# Patient Record
Sex: Female | Born: 1965
Health system: Southern US, Community
[De-identification: ages and names within clinical notes are randomized; demographics above are authoritative.]

---

## 2000-05-29 HISTORY — PX: BREAST BIOPSY: SHX20

## 2000-05-29 HISTORY — PX: BREAST EXCISIONAL BIOPSY: SUR124

## 2004-05-13 ENCOUNTER — Ambulatory Visit: Payer: Self-pay | Admitting: Obstetrics and Gynecology

## 2005-06-27 ENCOUNTER — Ambulatory Visit: Payer: Self-pay | Admitting: Obstetrics and Gynecology

## 2005-07-06 ENCOUNTER — Ambulatory Visit: Payer: Self-pay | Admitting: Obstetrics and Gynecology

## 2006-06-29 ENCOUNTER — Ambulatory Visit: Payer: Self-pay | Admitting: Obstetrics and Gynecology

## 2007-07-12 ENCOUNTER — Ambulatory Visit: Payer: Self-pay | Admitting: Obstetrics and Gynecology

## 2007-07-17 ENCOUNTER — Ambulatory Visit: Payer: Self-pay | Admitting: Obstetrics and Gynecology

## 2008-04-06 ENCOUNTER — Ambulatory Visit: Payer: Self-pay | Admitting: General Practice

## 2008-07-17 ENCOUNTER — Ambulatory Visit: Payer: Self-pay | Admitting: Obstetrics and Gynecology

## 2009-07-19 ENCOUNTER — Ambulatory Visit: Payer: Self-pay | Admitting: Obstetrics and Gynecology

## 2010-07-20 ENCOUNTER — Ambulatory Visit: Payer: Self-pay | Admitting: Obstetrics and Gynecology

## 2011-08-23 ENCOUNTER — Ambulatory Visit: Payer: Self-pay | Admitting: Obstetrics and Gynecology

## 2012-08-23 ENCOUNTER — Ambulatory Visit: Payer: Self-pay | Admitting: Obstetrics and Gynecology

## 2013-08-29 ENCOUNTER — Ambulatory Visit: Payer: Self-pay | Admitting: Obstetrics and Gynecology

## 2014-03-02 ENCOUNTER — Encounter: Payer: Self-pay | Admitting: Podiatry

## 2014-03-02 ENCOUNTER — Ambulatory Visit (INDEPENDENT_AMBULATORY_CARE_PROVIDER_SITE_OTHER): Payer: BC Managed Care – PPO

## 2014-03-02 ENCOUNTER — Ambulatory Visit (INDEPENDENT_AMBULATORY_CARE_PROVIDER_SITE_OTHER): Payer: BC Managed Care – PPO | Admitting: Podiatry

## 2014-03-02 VITALS — BP 105/61 | HR 67 | Resp 16 | Ht 66.0 in | Wt 130.0 lb

## 2014-03-02 DIAGNOSIS — M21611 Bunion of right foot: Secondary | ICD-10-CM

## 2014-03-02 DIAGNOSIS — M2011 Hallux valgus (acquired), right foot: Secondary | ICD-10-CM

## 2014-03-02 NOTE — Progress Notes (Signed)
She presents today with light to discuss surgical intervention regarding her right foot. She states that her bunion deformity on her right foot has become more troublesome over the past few months it is becoming increasingly more painful and is limiting her daily activities. She can barely wear shoes with this. She like to have this surgically corrected. She denies any changes in her past medical history medications allergies are social history.  Objective: Pulses are stable she is alert and oriented x3 I have reviewed her past medical history medications allergies surgeries social history. Pulses are strongly palpable bilateral feet. She has pain on palpation and range of motion of the first metatarsophalangeal joint of the right foot over the left foot. She has pain on end range of motion of the first metatarsophalangeal joint right. Radiographic continuation demonstrates an increase in the first intermetatarsal angle with lateral deviation of the hallux. Both of these are greater than normal values.  Assessment hallux abductovalgus deformity right greater than left. Painful daily activities.  Plan: We went over consent form today line bylined number by number giving her ample time to ask questions she saw fit regarding an [place] bunion repair with screw fixation right first metatarsophalangeal joint. Answered all the questions regarding his procedures to the best of my ability in layman's terms. We did discuss a possible postop complications which may include but are not limited to postop pain bleeding swelling infection recurrence need for further surgery. She understands this and is amenable to it and signed all 3 pages of the consent form. She was dispensed a Cam Walker today for postop care and I will followup with her next month for surgical intervention and [facility] specialty surgical center

## 2014-03-04 ENCOUNTER — Ambulatory Visit: Payer: Self-pay | Admitting: General Practice

## 2014-04-08 ENCOUNTER — Other Ambulatory Visit: Payer: Self-pay | Admitting: Podiatry

## 2014-04-08 MED ORDER — OXYCODONE-ACETAMINOPHEN 10-325 MG PO TABS: 1.0000 | ORAL_TABLET | ORAL | Status: DC | PRN

## 2014-04-08 MED ORDER — PROMETHAZINE HCL 25 MG PO TABS: 25.0000 mg | ORAL_TABLET | Freq: Three times a day (TID) | ORAL | Status: DC | PRN

## 2014-04-08 MED ORDER — CEPHALEXIN 500 MG PO CAPS: 500.0000 mg | ORAL_CAPSULE | Freq: Three times a day (TID) | ORAL | Status: DC

## 2014-04-10 ENCOUNTER — Encounter: Payer: Self-pay | Admitting: Podiatry

## 2014-04-10 DIAGNOSIS — M2011 Hallux valgus (acquired), right foot: Secondary | ICD-10-CM

## 2014-04-12 ENCOUNTER — Telehealth: Payer: Self-pay

## 2014-04-12 NOTE — Telephone Encounter (Signed)
  Left message for patient to call with questions or concerns regarding post operative status 

## 2014-04-12 NOTE — Telephone Encounter (Signed)
Left message for patient to call with questions or concerns. 

## 2014-04-12 NOTE — Progress Notes (Signed)
Dr Milinda Pointer performed a right [place] bunion repair on 04/10/14 with screw placement

## 2014-04-15 ENCOUNTER — Ambulatory Visit (INDEPENDENT_AMBULATORY_CARE_PROVIDER_SITE_OTHER): Payer: BC Managed Care – PPO | Admitting: Podiatry

## 2014-04-15 ENCOUNTER — Encounter: Payer: Self-pay | Admitting: Podiatry

## 2014-04-15 ENCOUNTER — Ambulatory Visit (INDEPENDENT_AMBULATORY_CARE_PROVIDER_SITE_OTHER): Payer: BC Managed Care – PPO

## 2014-04-15 VITALS — BP 105/65 | HR 70 | Temp 98.7°F | Resp 16

## 2014-04-15 DIAGNOSIS — M2011 Hallux valgus (acquired), right foot: Secondary | ICD-10-CM

## 2014-04-15 DIAGNOSIS — M21611 Bunion of right foot: Secondary | ICD-10-CM

## 2014-04-15 DIAGNOSIS — Z9889 Other specified postprocedural states: Secondary | ICD-10-CM

## 2014-04-15 NOTE — Progress Notes (Signed)
She presents today status post one week [place] bunion repair with screw right foot. She states that she's had absolutely no pain.  Objective: Vital signs are stable she is alert and oriented 3. She presents with her Cam Walker intact. Dry sterile dressing was removed demonstrates no erythema is mild edema no cellulitis drainage or odor. Sutures are in place margins appear to be well coapted. She has great range of motion of the toe without pain. Radiographs confirm the placement of the capital fragment and screw.  Assessment well-healing surgical toe status post one week [place] bunion repair right.  Plan: [place] today drastic compressive dressing follow-up with me after Thanksgiving for anklet and a Darco shoe.

## 2014-04-16 ENCOUNTER — Telehealth: Payer: Self-pay | Admitting: *Deleted

## 2014-04-16 NOTE — Telephone Encounter (Signed)
PT CALLED WANTING TO KNOW IF SHE NEEDED TO STILL SLEEP IN THE BOOT AT NIGHT. TOLD PT TO SLEEP IN BOOT EVERY NIGHT TILL SHE COMES BACK TO HER NEXT APPT. PT UNDERSTOOD.

## 2014-04-27 ENCOUNTER — Ambulatory Visit (INDEPENDENT_AMBULATORY_CARE_PROVIDER_SITE_OTHER): Payer: BC Managed Care – PPO | Admitting: Podiatry

## 2014-04-27 VITALS — BP 101/60 | HR 81 | Resp 16

## 2014-04-27 DIAGNOSIS — Z9889 Other specified postprocedural states: Secondary | ICD-10-CM

## 2014-04-27 NOTE — Progress Notes (Signed)
She presents a days to weeks status post [facility] bunion repair right foot. States it really hasn't bothered me much at all.  Objective: Vital signs are stable she is alert and oriented 3. No erythema edema cellulitis drainage or odor margins remain well coapted she has a great range of motion of the first metatarsophalangeal joint right foot.  Assessment: Well-healing surgical foot right.  Plan: Continue range of motion exercises placed her in a compression anklet and a Darco shoe today I will follow up with her in 2 weeks for another set of x-rays.

## 2014-05-11 ENCOUNTER — Ambulatory Visit (INDEPENDENT_AMBULATORY_CARE_PROVIDER_SITE_OTHER): Payer: BC Managed Care – PPO

## 2014-05-11 ENCOUNTER — Ambulatory Visit (INDEPENDENT_AMBULATORY_CARE_PROVIDER_SITE_OTHER): Payer: BC Managed Care – PPO | Admitting: Podiatry

## 2014-05-11 ENCOUNTER — Encounter: Payer: Self-pay | Admitting: Podiatry

## 2014-05-11 DIAGNOSIS — M21611 Bunion of right foot: Secondary | ICD-10-CM

## 2014-05-11 DIAGNOSIS — Z9889 Other specified postprocedural states: Secondary | ICD-10-CM

## 2014-05-11 DIAGNOSIS — M2011 Hallux valgus (acquired), right foot: Secondary | ICD-10-CM

## 2014-05-11 NOTE — Progress Notes (Signed)
She presents today for follow-up of her [facility] bunion repair right foot. She states that it feels a little tight.  Objective: Vital signs are stable she is alert and oriented 3. R osteotomy with screw fixation appears to be healing very nicely on x-ray. He has yet to be 1 month since her surgery. She has good range of motion of the first metatarsophalangeal joint of the right foot and minimal edema.  Assessment: Sonya Burke surgical foot right.  Plan: I would allow her to get back into a pair of tennis shoes over the next couple of weeks and I'll follow-up with her in 1 month for another set of x-rays.

## 2014-06-08 ENCOUNTER — Ambulatory Visit (INDEPENDENT_AMBULATORY_CARE_PROVIDER_SITE_OTHER): Payer: BLUE CROSS/BLUE SHIELD

## 2014-06-08 ENCOUNTER — Ambulatory Visit (INDEPENDENT_AMBULATORY_CARE_PROVIDER_SITE_OTHER): Payer: BLUE CROSS/BLUE SHIELD | Admitting: Podiatry

## 2014-06-08 VITALS — BP 108/59 | HR 81 | Resp 16

## 2014-06-08 DIAGNOSIS — M2011 Hallux valgus (acquired), right foot: Secondary | ICD-10-CM

## 2014-06-08 DIAGNOSIS — M21611 Bunion of right foot: Secondary | ICD-10-CM

## 2014-06-08 DIAGNOSIS — Z9889 Other specified postprocedural states: Secondary | ICD-10-CM

## 2014-06-08 NOTE — Progress Notes (Signed)
She presents today 2 months status post [facility] bunion repair left foot. She states it seems to be doing well.  Objective: Vital signs are stable she is alert and oriented 3 she has great range of motion first metatarsophalangeal joint. Radiographs confirm well-healed osteotomy.  Assessment: Well-healing surgical foot status post [place] bunion repair with screw fixation.  Plan: Release her and I will follow-up with her as needed.

## 2014-09-04 ENCOUNTER — Ambulatory Visit
Admit: 2014-09-04 | Disposition: A | Discharge status: Home / Self Care | Payer: Self-pay | Attending: Obstetrics and Gynecology | Admitting: Obstetrics and Gynecology

## 2015-08-31 ENCOUNTER — Other Ambulatory Visit: Payer: Self-pay | Admitting: Obstetrics and Gynecology

## 2015-08-31 DIAGNOSIS — Z1231 Encounter for screening mammogram for malignant neoplasm of breast: Secondary | ICD-10-CM

## 2015-09-06 ENCOUNTER — Ambulatory Visit
Admission: RE | Admit: 2015-09-06 | Discharge: 2015-09-06 | Disposition: A | Discharge status: Home / Self Care | Payer: BLUE CROSS/BLUE SHIELD | Source: Ambulatory Visit | Attending: Obstetrics and Gynecology | Admitting: Obstetrics and Gynecology

## 2015-09-06 DIAGNOSIS — Z1231 Encounter for screening mammogram for malignant neoplasm of breast: Secondary | ICD-10-CM | POA: Insufficient documentation

## 2015-09-08 ENCOUNTER — Other Ambulatory Visit: Payer: Self-pay | Admitting: Obstetrics and Gynecology

## 2015-09-08 DIAGNOSIS — R928 Other abnormal and inconclusive findings on diagnostic imaging of breast: Secondary | ICD-10-CM

## 2015-09-14 ENCOUNTER — Ambulatory Visit
Admission: RE | Admit: 2015-09-14 | Discharge: 2015-09-14 | Disposition: A | Discharge status: Home / Self Care | Payer: BLUE CROSS/BLUE SHIELD | Source: Ambulatory Visit | Attending: Obstetrics and Gynecology | Admitting: Obstetrics and Gynecology

## 2015-09-14 DIAGNOSIS — N63 Unspecified lump in breast: Secondary | ICD-10-CM | POA: Diagnosis not present

## 2015-09-14 DIAGNOSIS — N6041 Mammary duct ectasia of right breast: Secondary | ICD-10-CM | POA: Insufficient documentation

## 2015-09-14 DIAGNOSIS — R928 Other abnormal and inconclusive findings on diagnostic imaging of breast: Secondary | ICD-10-CM

## 2016-09-05 ENCOUNTER — Other Ambulatory Visit: Payer: Self-pay | Admitting: Obstetrics and Gynecology

## 2016-09-05 DIAGNOSIS — Z1231 Encounter for screening mammogram for malignant neoplasm of breast: Secondary | ICD-10-CM

## 2016-09-26 ENCOUNTER — Ambulatory Visit
Admission: RE | Admit: 2016-09-26 | Discharge: 2016-09-26 | Disposition: A | Discharge status: Home / Self Care | Payer: BLUE CROSS/BLUE SHIELD | Source: Ambulatory Visit | Attending: Obstetrics and Gynecology | Admitting: Obstetrics and Gynecology

## 2016-09-26 DIAGNOSIS — Z1231 Encounter for screening mammogram for malignant neoplasm of breast: Secondary | ICD-10-CM | POA: Insufficient documentation

## 2017-07-12 IMAGING — MG MM DIGITAL SCREENING BILAT W/ CAD
4 series · 4 of 4 positions shown · non-contrast
Comparison: Previous exam(s).

CLINICAL DATA: Screening.

EXAM:
DIGITAL SCREENING BILATERAL MAMMOGRAM WITH CAD

[L MLO]
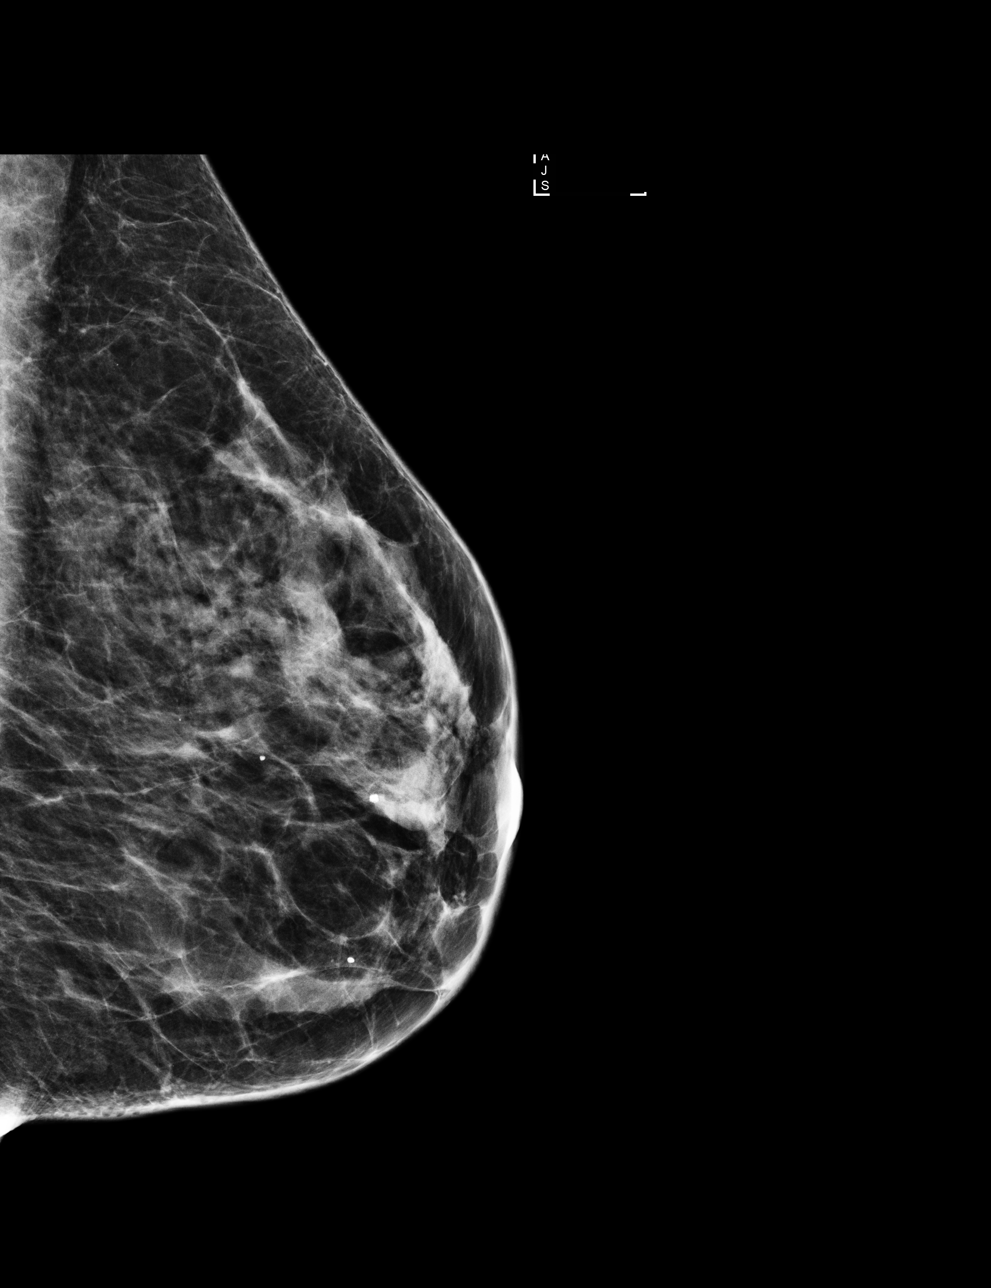

[R CC]
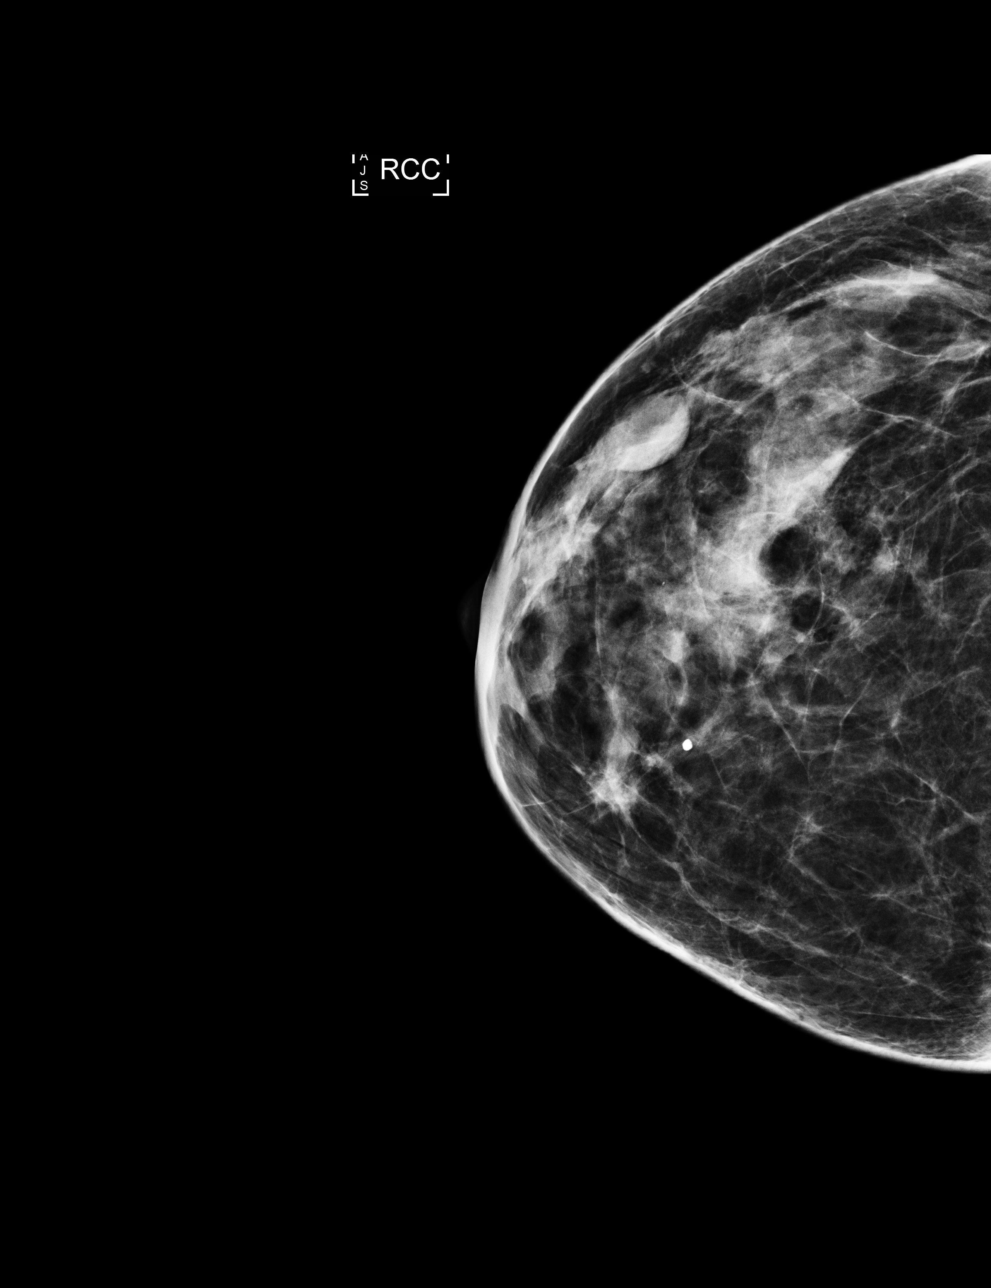

[R MLO]
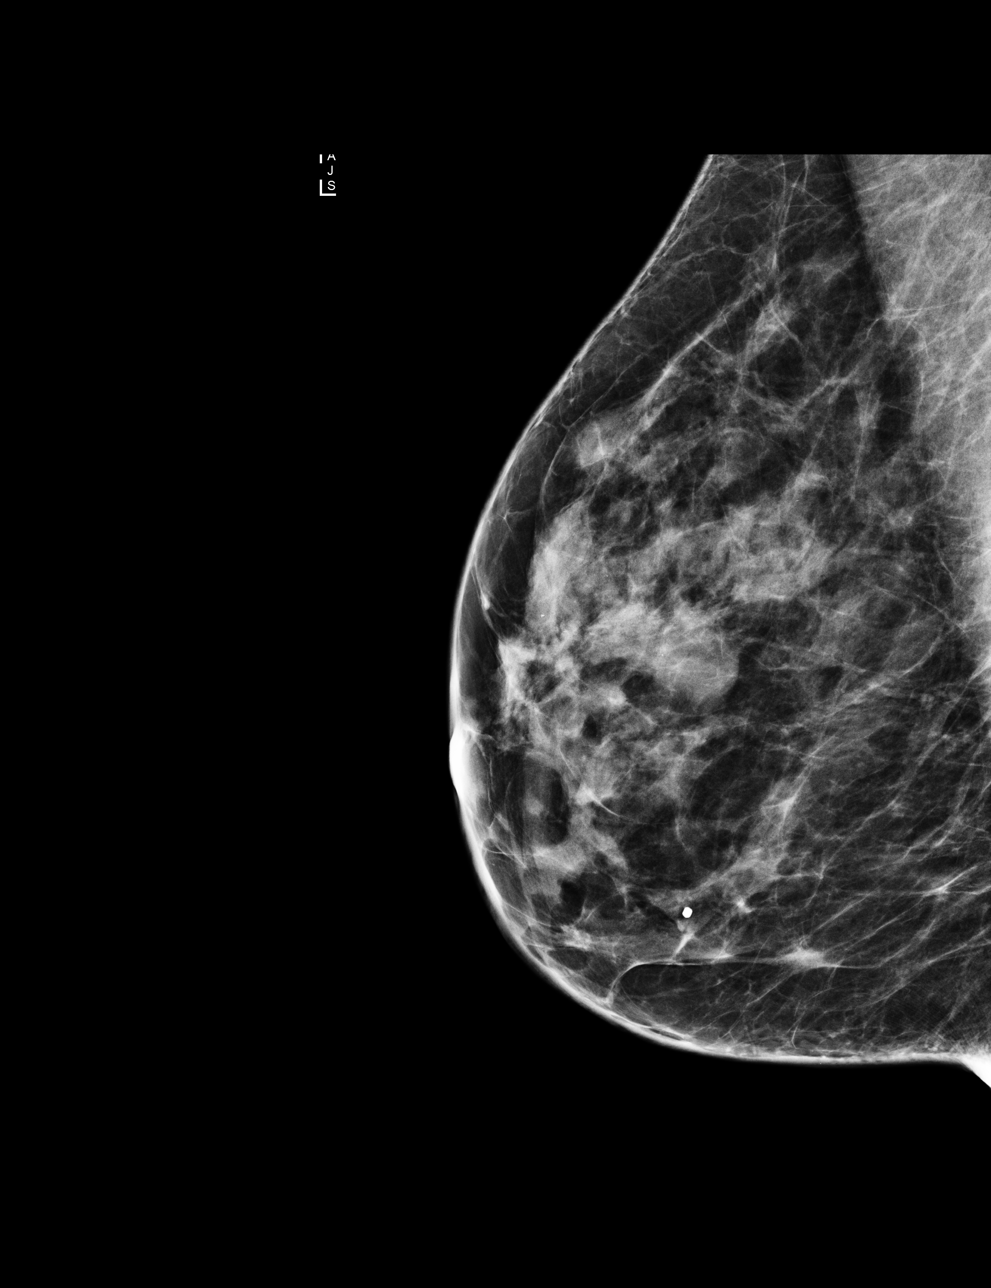

[L CC]
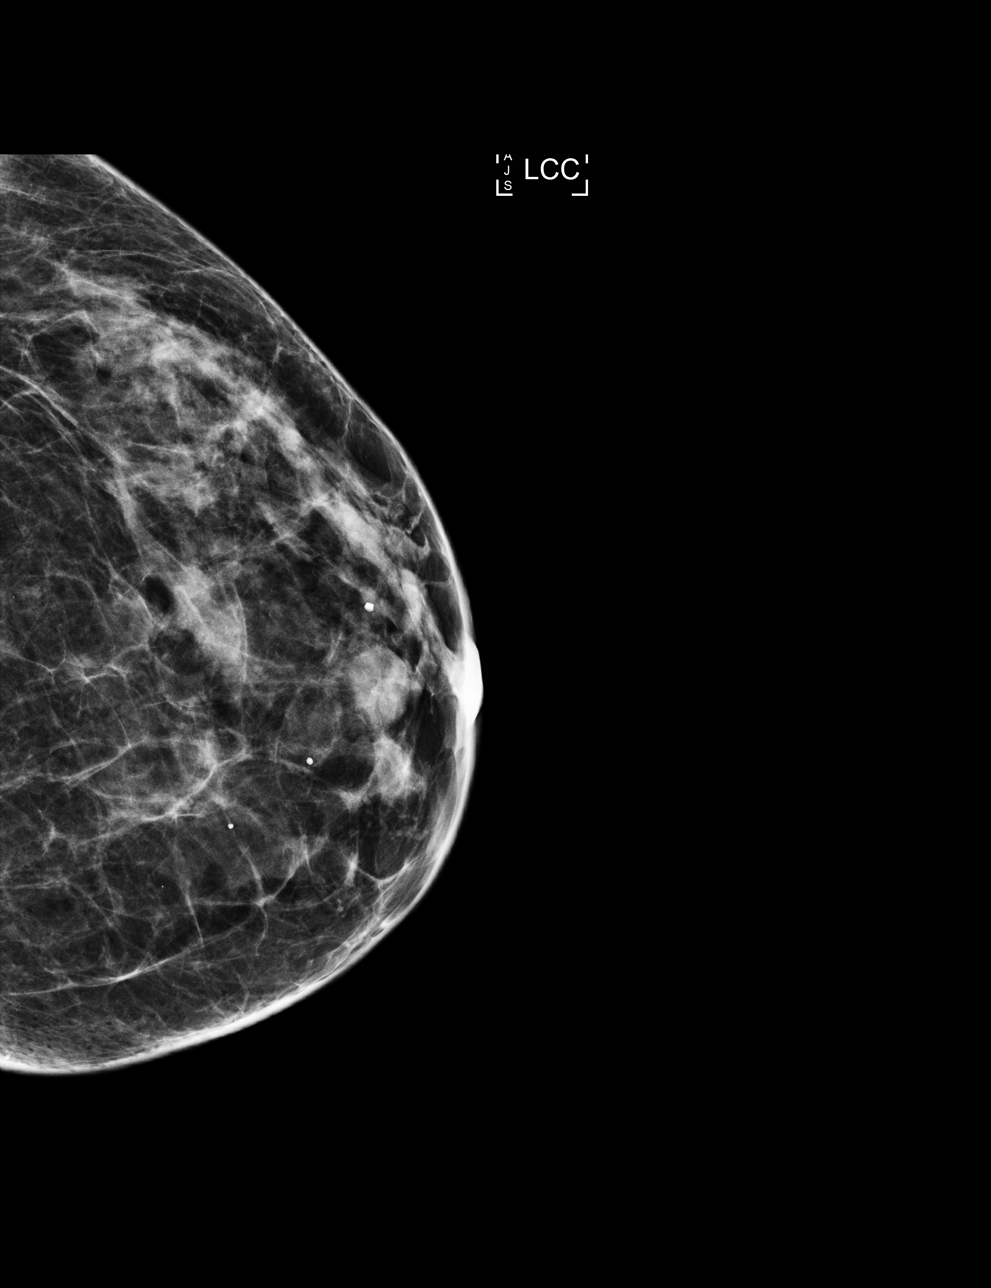

[4 of 4 positions shown; findings below may reference images not displayed]

ACR Breast Density Category c: The breast tissue is heterogeneously
dense, which may obscure small masses.
FINDINGS: In the right breast, a possible mass warrants further evaluation. In
the left breast, no findings suspicious for malignancy. Images were
processed with CAD.
IMPRESSION: Further evaluation is suggested for possible mass in the right
breast.

RECOMMENDATION:
Diagnostic mammogram and possibly ultrasound of the right breast.
(Code:[id number])

The patient will be contacted regarding the findings, and additional
imaging will be scheduled.

BI-RADS CATEGORY  0: Incomplete. Need additional imaging evaluation
and/or prior mammograms for comparison.

## 2017-09-06 ENCOUNTER — Other Ambulatory Visit: Payer: Self-pay | Admitting: Obstetrics and Gynecology

## 2017-09-06 DIAGNOSIS — Z1231 Encounter for screening mammogram for malignant neoplasm of breast: Secondary | ICD-10-CM

## 2017-09-27 ENCOUNTER — Ambulatory Visit
Admission: RE | Admit: 2017-09-27 | Discharge: 2017-09-27 | Disposition: A | Discharge status: Home / Self Care | Payer: BLUE CROSS/BLUE SHIELD | Source: Ambulatory Visit | Attending: Obstetrics and Gynecology | Admitting: Obstetrics and Gynecology

## 2017-09-27 DIAGNOSIS — Z1231 Encounter for screening mammogram for malignant neoplasm of breast: Secondary | ICD-10-CM | POA: Diagnosis present

## 2018-10-14 ENCOUNTER — Other Ambulatory Visit: Payer: Self-pay | Admitting: Obstetrics and Gynecology

## 2018-10-14 DIAGNOSIS — Z1231 Encounter for screening mammogram for malignant neoplasm of breast: Secondary | ICD-10-CM

## 2018-11-13 ENCOUNTER — Ambulatory Visit
Admission: RE | Admit: 2018-11-13 | Discharge: 2018-11-13 | Disposition: A | Discharge status: Home / Self Care | Payer: BC Managed Care – PPO | Source: Ambulatory Visit | Attending: Obstetrics and Gynecology | Admitting: Obstetrics and Gynecology

## 2018-11-13 ENCOUNTER — Other Ambulatory Visit: Payer: Self-pay

## 2018-11-13 DIAGNOSIS — Z1231 Encounter for screening mammogram for malignant neoplasm of breast: Secondary | ICD-10-CM | POA: Insufficient documentation

## 2019-02-14 DIAGNOSIS — M531 Cervicobrachial syndrome: Secondary | ICD-10-CM | POA: Diagnosis not present

## 2019-02-14 DIAGNOSIS — M9902 Segmental and somatic dysfunction of thoracic region: Secondary | ICD-10-CM | POA: Diagnosis not present

## 2019-02-14 DIAGNOSIS — M9901 Segmental and somatic dysfunction of cervical region: Secondary | ICD-10-CM | POA: Diagnosis not present

## 2019-02-27 ENCOUNTER — Ambulatory Visit: Payer: Self-pay

## 2019-02-27 DIAGNOSIS — Z23 Encounter for immunization: Secondary | ICD-10-CM

## 2019-09-16 ENCOUNTER — Other Ambulatory Visit: Payer: Self-pay

## 2019-09-19 ENCOUNTER — Other Ambulatory Visit: Payer: Self-pay

## 2019-09-19 DIAGNOSIS — Z0000 Encounter for general adult medical examination without abnormal findings: Secondary | ICD-10-CM

## 2019-09-19 NOTE — Progress Notes (Signed)
Requesting lab panel for upcoming physical with outside provider, Dr. Benjaman Kindler.  AMD

## 2019-09-20 LAB — CMP12+LP+TP+TSH+6AC+CBC/D/PLT
ALT: 10 [IU]/L (ref 0–32)
AST: 13 [IU]/L (ref 0–40)
Albumin/Globulin Ratio: 1.8 (ref 1.2–2.2)
Albumin: 4.4 g/dL (ref 3.8–4.9)
Alkaline Phosphatase: 42 [IU]/L (ref 39–117)
BUN/Creatinine Ratio: 15 (ref 9–23)
BUN: 11 mg/dL (ref 6–24)
Basophils Absolute: 0.1 10*3/uL (ref 0.0–0.2)
Basos: 2 %
Bilirubin Total: 0.4 mg/dL (ref 0.0–1.2)
Calcium: 8.7 mg/dL (ref 8.7–10.2)
Chloride: 101 mmol/L (ref 96–106)
Chol/HDL Ratio: 2.3 ratio (ref 0.0–4.4)
Cholesterol, Total: 164 mg/dL (ref 100–199)
Creatinine, Ser: 0.74 mg/dL (ref 0.57–1.00)
EOS (ABSOLUTE): 0.1 10*3/uL (ref 0.0–0.4)
Eos: 4 %
Free Thyroxine Index: 1.9 (ref 1.2–4.9)
GFR calc Af Amer: 106 mL/min/{1.73_m2}
GFR calc non Af Amer: 92 mL/min/{1.73_m2}
GGT: 9 [IU]/L (ref 0–60)
Globulin, Total: 2.4 g/dL (ref 1.5–4.5)
Glucose: 96 mg/dL (ref 65–99)
HDL: 70 mg/dL
Hematocrit: 39.2 % (ref 34.0–46.6)
Hemoglobin: 12.9 g/dL (ref 11.1–15.9)
Immature Grans (Abs): 0 10*3/uL (ref 0.0–0.1)
Immature Granulocytes: 0 %
Iron: 63 ug/dL (ref 27–159)
LDH: 120 [IU]/L (ref 119–226)
LDL Chol Calc (NIH): 85 mg/dL (ref 0–99)
Lymphocytes Absolute: 1.2 10*3/uL (ref 0.7–3.1)
Lymphs: 41 %
MCH: 28.7 pg (ref 26.6–33.0)
MCHC: 32.9 g/dL (ref 31.5–35.7)
MCV: 87 fL (ref 79–97)
Monocytes Absolute: 0.2 10*3/uL (ref 0.1–0.9)
Monocytes: 7 %
Neutrophils Absolute: 1.4 10*3/uL (ref 1.4–7.0)
Neutrophils: 46 %
Phosphorus: 3.5 mg/dL (ref 3.0–4.3)
Platelets: 246 10*3/uL (ref 150–450)
Potassium: 4.1 mmol/L (ref 3.5–5.2)
RBC: 4.49 x10E6/uL (ref 3.77–5.28)
RDW: 12.8 % (ref 11.7–15.4)
Sodium: 135 mmol/L (ref 134–144)
T3 Uptake Ratio: 27 % (ref 24–39)
T4, Total: 7.2 ug/dL (ref 4.5–12.0)
TSH: 1.89 u[IU]/mL (ref 0.450–4.500)
Total Protein: 6.8 g/dL (ref 6.0–8.5)
Triglycerides: 40 mg/dL (ref 0–149)
Uric Acid: 3.5 mg/dL (ref 3.0–7.2)
VLDL Cholesterol Cal: 9 mg/dL (ref 5–40)
WBC: 3.1 10*3/uL — ABNORMAL LOW (ref 3.4–10.8)

## 2019-09-30 ENCOUNTER — Other Ambulatory Visit: Payer: Self-pay | Admitting: Obstetrics and Gynecology

## 2019-09-30 DIAGNOSIS — Z1231 Encounter for screening mammogram for malignant neoplasm of breast: Secondary | ICD-10-CM | POA: Diagnosis not present

## 2019-09-30 DIAGNOSIS — Z01419 Encounter for gynecological examination (general) (routine) without abnormal findings: Secondary | ICD-10-CM | POA: Diagnosis not present

## 2019-09-30 DIAGNOSIS — Z124 Encounter for screening for malignant neoplasm of cervix: Secondary | ICD-10-CM | POA: Diagnosis not present

## 2019-10-01 ENCOUNTER — Ambulatory Visit (INDEPENDENT_AMBULATORY_CARE_PROVIDER_SITE_OTHER): Payer: 59 | Admitting: Dermatology

## 2019-10-01 ENCOUNTER — Other Ambulatory Visit: Payer: Self-pay

## 2019-10-01 DIAGNOSIS — D229 Melanocytic nevi, unspecified: Secondary | ICD-10-CM | POA: Diagnosis not present

## 2019-10-01 DIAGNOSIS — D2272 Melanocytic nevi of left lower limb, including hip: Secondary | ICD-10-CM | POA: Diagnosis not present

## 2019-10-01 DIAGNOSIS — L300 Nummular dermatitis: Secondary | ICD-10-CM

## 2019-10-01 DIAGNOSIS — L578 Other skin changes due to chronic exposure to nonionizing radiation: Secondary | ICD-10-CM

## 2019-10-01 DIAGNOSIS — D2262 Melanocytic nevi of left upper limb, including shoulder: Secondary | ICD-10-CM | POA: Diagnosis not present

## 2019-10-01 DIAGNOSIS — L814 Other melanin hyperpigmentation: Secondary | ICD-10-CM

## 2019-10-01 DIAGNOSIS — L719 Rosacea, unspecified: Secondary | ICD-10-CM

## 2019-10-01 DIAGNOSIS — D1801 Hemangioma of skin and subcutaneous tissue: Secondary | ICD-10-CM | POA: Diagnosis not present

## 2019-10-01 DIAGNOSIS — D492 Neoplasm of unspecified behavior of bone, soft tissue, and skin: Secondary | ICD-10-CM

## 2019-10-01 DIAGNOSIS — L820 Inflamed seborrheic keratosis: Secondary | ICD-10-CM

## 2019-10-01 DIAGNOSIS — D485 Neoplasm of uncertain behavior of skin: Secondary | ICD-10-CM | POA: Diagnosis not present

## 2019-10-01 DIAGNOSIS — Z1283 Encounter for screening for malignant neoplasm of skin: Secondary | ICD-10-CM

## 2019-10-01 DIAGNOSIS — D225 Melanocytic nevi of trunk: Secondary | ICD-10-CM | POA: Diagnosis not present

## 2019-10-01 DIAGNOSIS — L081 Erythrasma: Secondary | ICD-10-CM | POA: Diagnosis not present

## 2019-10-01 DIAGNOSIS — L821 Other seborrheic keratosis: Secondary | ICD-10-CM

## 2019-10-01 MED ORDER — CLINDAMYCIN PHOSPHATE 1 % EX LOTN: TOPICAL_LOTION | Freq: Two times a day (BID) | CUTANEOUS | 0 refills | Status: DC

## 2019-10-01 NOTE — Patient Instructions (Signed)

## 2019-10-01 NOTE — Progress Notes (Signed)
New Patient Visit  Subjective  Sonya Burke is a 54 y.o. female who presents for the following: Annual Exam (TBSE, no hx of skin ca, no fhx skin ca) and growths (inframammary, irritated by bra). Patient presents for total body skin examination for skin cancer screening and mole check.  The following portions of the chart were reviewed this encounter and updated as appropriate:  Allergies  Meds  Problems  Med Hx  Surg Hx  Fam Hx      Review of Systems:  No other skin or systemic complaints except as noted in HPI or Assessment and Plan.  Objective  Well appearing patient in no apparent distress; mood and affect are within normal limits.  A full examination was performed including scalp, head, eyes, ears, nose, lips, neck, chest, axillae, abdomen, back, buttocks, bilateral upper extremities, bilateral lower extremities, hands, feet, fingers, toes, fingernails, and toenails. All findings within normal limits unless otherwise noted below.  Objective  Pubic: Brown discoloration  Objective  Left Inframammary x 4 (4): Erythematous keratotic or waxy stuck-on papule or plaque.   Objective  LUQA lat: 0.6cm dark brown pap    Objective  Left Antecubital: 3.88mm black macule  Objective    Right Thigh - Anterior: 0.5cm brown macule  Left Upper Back paraspinal: 0.5 x 0.4cm brown macule  L pubic area: 1.0cm brown macule  Images        Objective  Bil hips: Scaly erythematous papules and plaques   Objective  face: Erythema and telangiectasias mid face   Assessment & Plan    Melanocytic Nevi - Tan-brown and/or pink-flesh-colored symmetric macules and papules - Benign appearing on exam today - Observation - Call clinic for new or changing moles - Recommend daily use of broad spectrum spf 30+ sunscreen to sun-exposed areas.   Hemangiomas - Red papules - Discussed benign nature - Observe - Call for any changes  Actinic Damage - diffuse scaly  erythematous macules with underlying dyspigmentation - Recommend daily broad spectrum sunscreen SPF 30+ to sun-exposed areas, reapply every 2 hours as needed.  - Call for new or changing lesions.  Lentigines - Scattered tan macules - Discussed due to sun exposure - Benign, observe - Call for any changes  Seborrheic Keratoses - Stuck-on, waxy, tan-brown papules and plaques  - Discussed benign etiology and prognosis. - Observe - Call for any changes   Erythrasma Pubic  Start Clindamycin lotion bid aa until clear, then prn flares  clindamycin (CLEOCIN-T) 1 % lotion - Pubic  Inflamed seborrheic keratosis (4) Left Inframammary x 4  Destruction of lesion - Left Inframammary x 4 Complexity: simple   Destruction method: cryotherapy   Informed consent: discussed and consent obtained   Timeout:  patient name, date of birth, surgical site, and procedure verified Lesion destroyed using liquid nitrogen: Yes   Region frozen until ice ball extended beyond lesion: Yes   Outcome: patient tolerated procedure well with no complications   Post-procedure details: wound care instructions given    Neoplasm of skin (2) LUQA lat  Epidermal / dermal shaving  Lesion length (cm):  0.6 Lesion width (cm):  0.6 Margin per side (cm):  0.2 Total excision diameter (cm):  1 Informed consent: discussed and consent obtained   Timeout: patient name, date of birth, surgical site, and procedure verified   Procedure prep:  Patient was prepped and draped in usual sterile fashion Prep type:  Isopropyl alcohol Anesthesia: the lesion was anesthetized in a standard fashion   Anesthetic:  1% lidocaine w/ epinephrine 1-100,000 buffered w/ 8.4% NaHCO3 Instrument used: flexible razor blade   Hemostasis achieved with: pressure, aluminum chloride and electrodesiccation   Outcome: patient tolerated procedure well   Post-procedure details: sterile dressing applied and wound care instructions given   Dressing type:  bandage and petrolatum    Specimen 1 - Surgical pathology Differential Diagnosis: Nevus vs Dysplastic Nevus Check Margins: No 0.6cm dark brown pap  Left Antecubital  Epidermal / dermal shaving  Lesion length (cm):  0.3 Lesion width (cm):  0.3 Margin per side (cm):  0.2 Total excision diameter (cm):  0.7 Informed consent: discussed and consent obtained   Timeout: patient name, date of birth, surgical site, and procedure verified   Procedure prep:  Patient was prepped and draped in usual sterile fashion Prep type:  Isopropyl alcohol Anesthesia: the lesion was anesthetized in a standard fashion   Anesthetic:  1% lidocaine w/ epinephrine 1-100,000 buffered w/ 8.4% NaHCO3 Instrument used: flexible razor blade   Hemostasis achieved with: pressure, aluminum chloride and electrodesiccation   Outcome: patient tolerated procedure well   Post-procedure details: sterile dressing applied and wound care instructions given   Dressing type: bandage and petrolatum    Specimen 2 - Surgical pathology Differential Diagnosis: Nevus vs Dysplastic Nevus Check Margins: No 3.27mm black macule  Nevus (3) Right Thigh - Anterior; L pubic area; Left Upper Back paraspinal  Observe for changes  Nummular dermatitis Bil hips  Discussed otc HC cream prn flares  Rosacea face  Discussed BBL, $350 per txt session, may need several txts for best results.  Info on BBL given to pt.  Skin cancer screening  Return in about 1 year (around 09/30/2020) for TBSE.     Documentation: I have reviewed the above documentation for accuracy and completeness, and I agree with the above.  Sarina Ser, MD   I, Othelia Pulling, RMA, am acting as scribe for Sarina Ser, MD .  Documentation: I have reviewed the above documentation for accuracy and completeness, and I agree with the above.  Sarina Ser, MD

## 2019-10-03 ENCOUNTER — Encounter: Payer: Self-pay | Admitting: Dermatology

## 2019-10-08 ENCOUNTER — Telehealth: Payer: Self-pay

## 2019-10-08 NOTE — Telephone Encounter (Signed)
error 

## 2019-10-23 DIAGNOSIS — M9902 Segmental and somatic dysfunction of thoracic region: Secondary | ICD-10-CM | POA: Diagnosis not present

## 2019-10-23 DIAGNOSIS — M9901 Segmental and somatic dysfunction of cervical region: Secondary | ICD-10-CM | POA: Diagnosis not present

## 2019-10-23 DIAGNOSIS — M531 Cervicobrachial syndrome: Secondary | ICD-10-CM | POA: Diagnosis not present

## 2019-11-14 ENCOUNTER — Ambulatory Visit
Admission: RE | Admit: 2019-11-14 | Discharge: 2019-11-14 | Disposition: A | Discharge status: Home / Self Care | Payer: 59 | Source: Ambulatory Visit | Attending: Obstetrics and Gynecology | Admitting: Obstetrics and Gynecology

## 2019-11-14 DIAGNOSIS — Z1231 Encounter for screening mammogram for malignant neoplasm of breast: Secondary | ICD-10-CM | POA: Diagnosis not present

## 2019-11-17 ENCOUNTER — Ambulatory Visit (INDEPENDENT_AMBULATORY_CARE_PROVIDER_SITE_OTHER): Payer: Self-pay

## 2019-11-17 ENCOUNTER — Other Ambulatory Visit: Payer: Self-pay

## 2019-11-17 DIAGNOSIS — I781 Nevus, non-neoplastic: Secondary | ICD-10-CM

## 2019-11-17 DIAGNOSIS — L719 Rosacea, unspecified: Secondary | ICD-10-CM

## 2020-01-26 ENCOUNTER — Other Ambulatory Visit: Payer: 59

## 2020-01-26 DIAGNOSIS — U071 COVID-19: Secondary | ICD-10-CM

## 2020-01-26 DIAGNOSIS — Z1152 Encounter for screening for COVID-19: Secondary | ICD-10-CM

## 2020-01-26 NOTE — Progress Notes (Signed)
Presents to the [facility] for outdoor collection of specimen for covid testing.  Spouse is symptomatic (H/A, congestion, fever & feels weak).  He was recently exposed to someone with covid.  He is fully vaccinated.  He did an at home covid test yesterday that was positive.  He is also being tested today through the clinic to confirm the at home results.  Presently asymptomatic.  Fully vaccinated.  Encouraged to limit contact with Richardson Landry.   Verbalized understanding.  Both have mychart & Abigail Butts will monitor for results.  AMD

## 2020-01-27 LAB — NOVEL CORONAVIRUS, NAA: SARS-CoV-2, NAA: NOT DETECTED

## 2020-02-18 ENCOUNTER — Ambulatory Visit: Payer: 59 | Admitting: Podiatry

## 2020-03-03 ENCOUNTER — Encounter: Payer: Self-pay | Admitting: Podiatry

## 2020-03-03 ENCOUNTER — Other Ambulatory Visit: Payer: Self-pay

## 2020-03-03 ENCOUNTER — Ambulatory Visit (INDEPENDENT_AMBULATORY_CARE_PROVIDER_SITE_OTHER): Payer: 59

## 2020-03-03 ENCOUNTER — Ambulatory Visit: Payer: 59 | Admitting: Podiatry

## 2020-03-03 DIAGNOSIS — M2012 Hallux valgus (acquired), left foot: Secondary | ICD-10-CM

## 2020-03-03 DIAGNOSIS — M2010 Hallux valgus (acquired), unspecified foot: Secondary | ICD-10-CM

## 2020-03-03 DIAGNOSIS — M75100 Unspecified rotator cuff tear or rupture of unspecified shoulder, not specified as traumatic: Secondary | ICD-10-CM | POA: Insufficient documentation

## 2020-03-03 DIAGNOSIS — M2011 Hallux valgus (acquired), right foot: Secondary | ICD-10-CM

## 2020-03-03 NOTE — Progress Notes (Signed)
  Subjective:  Patient ID: Sonya Burke, female    DOB: [birth year],  MRN: [id number] HPI Chief Complaint  Patient presents with  . Foot Pain    1st MPJ left - hav deformity x years, had right foot done 2016    54 y.o. female presents with the above complaint.   ROS: Denies fever chills nausea vomiting muscle aches pains calf pain back pain chest pain shortness of breath.  No past medical history on file. Past Surgical History:  Procedure Laterality Date  . BREAST BIOPSY Left 2002   benign   No current outpatient medications on file.  Allergies  Allergen Reactions  . Latex Rash    bandaids   Review of Systems Objective:  There were no vitals filed for this visit.  General: Well developed, nourished, in no acute distress, alert and oriented x3   Dermatological: Skin is warm, dry and supple bilateral. Nails x 10 are well maintained; remaining integument appears unremarkable at this time. There are no open sores, no preulcerative lesions, no rash or signs of infection present.  Vascular: Dorsalis Pedis artery and Posterior Tibial artery pedal pulses are 2/4 bilateral with immedate capillary fill time. Pedal hair growth present. No varicosities and no lower extremity edema present bilateral.   Neruologic: Grossly intact via light touch bilateral. Vibratory intact via tuning fork bilateral. Protective threshold with Semmes Wienstein monofilament intact to all pedal sites bilateral. Patellar and Achilles deep tendon reflexes 2+ bilateral. No Babinski or clonus noted bilateral.   Musculoskeletal: No gross boney pedal deformities bilateral. No pain, crepitus, or limitation noted with foot and ankle range of motion bilateral. Muscular strength 5/5 in all groups tested bilateral.  Pain on palpation first metatarsophalangeal joint left foot with pain on range of motion.  Minimal restriction in range of motion.  Pain on palpation of the medial bump.  Gait: Unassisted, Nonantalgic.     Radiographs:  Radiographs taken today demonstrate an increase in the first intermetatarsal angle of the left foot with an increase in the hallux abductus angle as well.  There is some minimal dorsal spurring of the head of the first metatarsal.  Some joint space narrowing laterally.  Assessment & Plan:   Assessment: Hallux abductovalgus deformity painful left.  Plan: Consented her today for an [facility] bunion repair with screw fixation.  We discussed the possible postop complications which may include but are not limited to postop pain bleeding swelling infection recurrence need for further surgery overcorrection under correction loss of digit loss of limb loss of life.  Dispensed information regarding the surgery center as well as information for the morning of surgery.  I will follow-up with her in the near future for surgical intervention.     Marrion Finan T. [place], [place]

## 2020-03-03 NOTE — Patient Instructions (Signed)
Pre-Operative Instructions  Congratulations, you have decided to take an important step towards improving your quality of life.  You can be assured that the doctors and staff at [facility] will be with you every step of the way.  Here are some important things you should know:  1. Plan to be at the surgery center/hospital at least 1 (one) hour prior to your scheduled time, unless otherwise directed by the surgical center/hospital staff.  You must have a responsible adult accompany you, remain during the surgery and drive you home.  Make sure you have directions to the surgical center/hospital to ensure you arrive on time. 2. If you are having surgery at [facility] or [facility] hospitals, you will need a copy of your medical history and physical form from your family physician within one month prior to the date of surgery. We will give you a form for your primary physician to complete.  3. We make every effort to accommodate the date you request for surgery.  However, there are times where surgery dates or times have to be moved.  We will contact you as soon as possible if a change in schedule is required.   4. No aspirin/ibuprofen for one week before surgery.  If you are on aspirin, any non-steroidal anti-inflammatory medications (Mobic, Aleve, Ibuprofen) should not be taken seven (7) days prior to your surgery.  You make take Tylenol for pain prior to surgery.  5. Medications - If you are taking daily heart and blood pressure medications, seizure, reflux, allergy, asthma, anxiety, pain or diabetes medications, make sure you notify the surgery center/hospital before the day of surgery so they can tell you which medications you should take or avoid the day of surgery. 6. No food or drink after midnight the night before surgery unless directed otherwise by surgical center/hospital staff. 7. No alcoholic beverages 24-hours prior to surgery.  No smoking 24-hours prior or 24-hours after  surgery. 8. Wear loose pants or shorts. They should be loose enough to fit over bandages, boots, and casts. 9. Don't wear slip-on shoes. Sneakers are preferred. 10. Bring your boot with you to the surgery center/hospital.  Also bring crutches or a walker if your physician has prescribed it for you.  If you do not have this equipment, it will be provided for you after surgery. 11. If you have not been contacted by the surgery center/hospital by the day before your surgery, call to confirm the date and time of your surgery. 12. Leave-time from work may vary depending on the type of surgery you have.  Appropriate arrangements should be made prior to surgery with your employer. 13. Prescriptions will be provided immediately following surgery by your doctor.  Fill these as soon as possible after surgery and take the medication as directed. Pain medications will not be refilled on weekends and must be approved by the doctor. 14. Remove nail polish on the operative foot and avoid getting pedicures prior to surgery. 15. Wash the night before surgery.  The night before surgery wash the foot and leg well with water and the antibacterial soap provided. Be sure to pay special attention to beneath the toenails and in between the toes.  Wash for at least three (3) minutes. Rinse thoroughly with water and dry well with a towel.  Perform this wash unless told not to do so by your physician.  Enclosed: 1 Ice pack (please put in freezer the night before surgery)   1 Hibiclens skin cleaner     Pre-op instructions  If you have any questions regarding the instructions, please do not hesitate to call our office.  [place]: [address], [place], [place] [address] -- [phone]  [place]: [address] [place], [place] [address] -- [phone]  [place]: [address], [place], [place] [address] -- [phone]   Website: [email or web address] 

## 2020-04-01 ENCOUNTER — Telehealth: Payer: Self-pay

## 2020-04-01 NOTE — Telephone Encounter (Signed)
DOS 04/16/2020  [place] BUNIONECTOMY LT - [id number]  [employer] EFFECTIVE DATE - 11/27/2018  PLAN DEDUCTIBLE - $2000.00 W/ $1602.20 REMAINING OUT OF POCKET - $5000.00 W/$4602.20 REMAINING COPAY $0.00 COINSURANCE - 70%  PER AUTOMATED SYSTEM NO PRECERT REQUIRED FOR CPT 28296. CALL REF #[id number]

## 2020-04-02 ENCOUNTER — Ambulatory Visit: Payer: Self-pay

## 2020-04-02 DIAGNOSIS — Z23 Encounter for immunization: Secondary | ICD-10-CM

## 2020-04-14 ENCOUNTER — Other Ambulatory Visit: Payer: Self-pay | Admitting: Podiatry

## 2020-04-14 MED ORDER — OXYCODONE-ACETAMINOPHEN 10-325 MG PO TABS: 1.0000 | ORAL_TABLET | Freq: Three times a day (TID) | ORAL | 0 refills | Status: AC | PRN

## 2020-04-14 MED ORDER — ONDANSETRON HCL 4 MG PO TABS: 4.0000 mg | ORAL_TABLET | Freq: Three times a day (TID) | ORAL | 0 refills | Status: DC | PRN

## 2020-04-14 MED ORDER — CEPHALEXIN 500 MG PO CAPS: 500.0000 mg | ORAL_CAPSULE | Freq: Three times a day (TID) | ORAL | 0 refills | Status: DC

## 2020-04-16 DIAGNOSIS — M25572 Pain in left ankle and joints of left foot: Secondary | ICD-10-CM | POA: Diagnosis not present

## 2020-04-16 DIAGNOSIS — M2012 Hallux valgus (acquired), left foot: Secondary | ICD-10-CM | POA: Diagnosis not present

## 2020-04-16 HISTORY — PX: BUNIONECTOMY: SHX129

## 2020-04-16 NOTE — Telephone Encounter (Signed)
This encounter was created in error - please disregard.

## 2020-04-21 ENCOUNTER — Ambulatory Visit (INDEPENDENT_AMBULATORY_CARE_PROVIDER_SITE_OTHER): Payer: 59 | Admitting: Podiatry

## 2020-04-21 ENCOUNTER — Encounter: Payer: Self-pay | Admitting: Podiatry

## 2020-04-21 ENCOUNTER — Ambulatory Visit (INDEPENDENT_AMBULATORY_CARE_PROVIDER_SITE_OTHER): Payer: 59

## 2020-04-21 ENCOUNTER — Other Ambulatory Visit: Payer: Self-pay

## 2020-04-21 VITALS — BP 115/77 | HR 67 | Temp 98.5°F

## 2020-04-21 DIAGNOSIS — M2012 Hallux valgus (acquired), left foot: Secondary | ICD-10-CM

## 2020-04-21 DIAGNOSIS — Z9889 Other specified postprocedural states: Secondary | ICD-10-CM

## 2020-04-21 NOTE — Progress Notes (Signed)
  Subjective:  Patient ID: Sonya Burke, female    DOB: 1965/09/27,  MRN: [id number]  Chief Complaint  Patient presents with  . Routine Post Op    POV number 1 DOS 04/16/2020 left foot Sonya Burke bunionectomy    DOS: 04/16/2020 Procedure: Left foot [facility] bunionectomy  54 y.o. female returns for post-op check.  Doing well.  Has minimal pain.  She is been ambulating the CAM boot.  Review of Systems: Negative except as noted in the HPI. Denies N/V/F/Ch.   Objective:   Vitals:   04/21/20 1035  BP: 115/77  Pulse: 67  Temp: 98.5 F (36.9 C)   There is no height or weight on file to calculate BMI. Constitutional Well developed. Well nourished.  Vascular Foot warm and well perfused. Capillary refill normal to all digits.   Neurologic Normal speech. Oriented to person, place, and time. Epicritic sensation to light touch grossly present bilaterally.  Dermatologic Skin healing well without signs of infection. Skin edges well coapted without signs of infection.  Orthopedic: Tenderness to palpation noted about the surgical site.   Radiographs: 3 weightbearing views of the left foot were taken with good surgical correction maintained and hardware in good position. Assessment:   1. Hallux valgus of left foot   2. S/P foot surgery, left    Plan:  Patient was evaluated and treated and all questions answered.  S/p foot surgery left -Progressing as expected post-operatively. -XR: As expected postoperative changes -WB Status: WBAT in CAM boot -Sutures: Absorbable sutures. -Medications: No refills required -Foot redressed.  Return in 1 week (on 04/28/2020).

## 2020-04-28 ENCOUNTER — Encounter: Payer: Self-pay | Admitting: Podiatry

## 2020-04-28 ENCOUNTER — Ambulatory Visit (INDEPENDENT_AMBULATORY_CARE_PROVIDER_SITE_OTHER): Payer: 59 | Admitting: Podiatry

## 2020-04-28 ENCOUNTER — Other Ambulatory Visit: Payer: Self-pay

## 2020-04-28 DIAGNOSIS — M2012 Hallux valgus (acquired), left foot: Secondary | ICD-10-CM

## 2020-04-28 DIAGNOSIS — Z9889 Other specified postprocedural states: Secondary | ICD-10-CM

## 2020-04-28 NOTE — Progress Notes (Signed)
Sonya Burke presents today for follow-up of her bunionectomy left foot.  She states that she is very excited to have this done she is experienced a little more pain with this 1 than she did previous surgery on her right foot.  Date of surgery 04/16/2020.  Objective: Vital signs are stable alert oriented x3 presents with a cam walker today once removed demonstrates dressing intact once removed demonstrates no erythema cellulitis drainage or odor.  Incision site is healing very nicely sutures removed today.  Margins remain well coapted she is got great range of motion of the first metatarsophalangeal joint.  Assessment: Well-healing surgical foot.  Plan: Placed her in a compression anklet today after removing the sutures and a Darco shoe and will follow up with her in 2 weeks for another set of x-rays

## 2020-05-12 ENCOUNTER — Ambulatory Visit (INDEPENDENT_AMBULATORY_CARE_PROVIDER_SITE_OTHER): Payer: 59 | Admitting: Podiatry

## 2020-05-12 ENCOUNTER — Other Ambulatory Visit: Payer: Self-pay

## 2020-05-12 ENCOUNTER — Telehealth: Payer: Self-pay | Admitting: *Deleted

## 2020-05-12 ENCOUNTER — Encounter: Payer: Self-pay | Admitting: Podiatry

## 2020-05-12 ENCOUNTER — Ambulatory Visit (INDEPENDENT_AMBULATORY_CARE_PROVIDER_SITE_OTHER): Payer: 59

## 2020-05-12 DIAGNOSIS — M2012 Hallux valgus (acquired), left foot: Secondary | ICD-10-CM

## 2020-05-12 DIAGNOSIS — Z9889 Other specified postprocedural states: Secondary | ICD-10-CM

## 2020-05-12 NOTE — Telephone Encounter (Signed)
You left your work identification card here.  "I'll come back to get it."  I'll leave it at the front desk.

## 2020-05-12 NOTE — Progress Notes (Signed)
She presents today postop visit date of surgery 04/16/2020 status post Liane Comber bunionectomy left states that is been doing okay as she refers to first metatarsophalangeal joint left foot.  Denies fever chills nausea vomiting muscle aches pains calf pain back pain chest pain shortness of breath.  Objective: Vital signs are stable she is alert and oriented x3.  Pulses are palpable.  There is no erythema edema cellulitis drainage or odor.  She has great healing as her scar is very fine and hair-like.  She has great range of motion of the first metatarsophalangeal joint radiographs taken today demonstrate well healing osteotomy with screw fixation.  Assessment: Well-healing surgical foot.  Plan: At this point I will allow her to start trying to get back into some regular shoe gear I will follow-up with her in

## 2020-05-12 NOTE — Telephone Encounter (Signed)
Card picked up by pt

## 2020-05-26 ENCOUNTER — Ambulatory Visit (INDEPENDENT_AMBULATORY_CARE_PROVIDER_SITE_OTHER): Payer: 59

## 2020-05-26 ENCOUNTER — Other Ambulatory Visit: Payer: Self-pay

## 2020-05-26 ENCOUNTER — Encounter: Payer: Self-pay | Admitting: Podiatry

## 2020-05-26 ENCOUNTER — Ambulatory Visit (INDEPENDENT_AMBULATORY_CARE_PROVIDER_SITE_OTHER): Payer: 59 | Admitting: Podiatry

## 2020-05-26 DIAGNOSIS — M2012 Hallux valgus (acquired), left foot: Secondary | ICD-10-CM

## 2020-05-26 DIAGNOSIS — Z9889 Other specified postprocedural states: Secondary | ICD-10-CM

## 2020-05-26 NOTE — Progress Notes (Signed)
She presents today for postop visit date of surgery 04/16/2020 so she is a little more than 4 weeks out now about 5 weeks status post [place] bunionectomy left foot.  States that is feeling better but is a little bit tight as she refers to the downward motion.  Objective: Vital signs are stable she is alert oriented x3.  Hallux left is somewhat limited on plantarflexion the dorsiflexion is very good.  Much decrease in edema there is no erythema cellulitis drainage or odor.  No pain on palpation or range of motion.  Radiographs taken today demonstrate well healing osteotomy internal fixation is in good position and there is already some remodeling along the lateral border of the met osteotomy.  Assessment: Well-healing surgical foot.  Plan: I am going to send her to physical therapy to gain a better range of motion of the first metatarsophalangeal joint.  I am also going to recommend that they evaluate the heel for plantar fasciitis and bursitis as well as peroneal tendinitis.

## 2020-05-27 NOTE — Addendum Note (Signed)
Addended by: Lottie Rater E on: 05/27/2020 11:58 AM   Modules accepted: Orders

## 2020-06-23 DIAGNOSIS — M21612 Bunion of left foot: Secondary | ICD-10-CM | POA: Diagnosis not present

## 2020-06-25 DIAGNOSIS — M21612 Bunion of left foot: Secondary | ICD-10-CM | POA: Diagnosis not present

## 2020-06-28 DIAGNOSIS — M21612 Bunion of left foot: Secondary | ICD-10-CM | POA: Diagnosis not present

## 2020-07-02 DIAGNOSIS — M21612 Bunion of left foot: Secondary | ICD-10-CM | POA: Diagnosis not present

## 2020-07-05 DIAGNOSIS — M21612 Bunion of left foot: Secondary | ICD-10-CM | POA: Diagnosis not present

## 2020-07-09 DIAGNOSIS — M21612 Bunion of left foot: Secondary | ICD-10-CM | POA: Diagnosis not present

## 2020-07-16 DIAGNOSIS — M21612 Bunion of left foot: Secondary | ICD-10-CM | POA: Diagnosis not present

## 2020-07-26 NOTE — Progress Notes (Signed)
Pt scheduled to complete physical 08/04/20 with Ron Derek Mound .CL,RMA

## 2020-07-30 ENCOUNTER — Ambulatory Visit: Payer: Self-pay

## 2020-07-30 ENCOUNTER — Other Ambulatory Visit: Payer: Self-pay

## 2020-07-30 DIAGNOSIS — Z01818 Encounter for other preprocedural examination: Secondary | ICD-10-CM

## 2020-07-30 LAB — POCT URINALYSIS DIPSTICK
Appearance: NORMAL
Bilirubin, UA: NEGATIVE
Blood, UA: NEGATIVE
Glucose, UA: NEGATIVE
Ketones, UA: NEGATIVE
Leukocytes, UA: NEGATIVE
Nitrite, UA: NEGATIVE
Protein, UA: POSITIVE — AB
Spec Grav, UA: 1.01 (ref 1.010–1.025)
Urobilinogen, UA: 0.2 E.U./dL
pH, UA: 7.5 (ref 5.0–8.0)

## 2020-07-31 LAB — CMP12+LP+TP+TSH+6AC+CBC/D/PLT
ALT: 9 IU/L (ref 0–32)
AST: 14 IU/L (ref 0–40)
Albumin/Globulin Ratio: 1.9 (ref 1.2–2.2)
Albumin: 4.4 g/dL (ref 3.8–4.9)
Alkaline Phosphatase: 55 IU/L (ref 44–121)
BUN/Creatinine Ratio: 14 (ref 9–23)
BUN: 9 mg/dL (ref 6–24)
Basophils Absolute: 0.1 10*3/uL (ref 0.0–0.2)
Basos: 2 %
Bilirubin Total: 0.3 mg/dL (ref 0.0–1.2)
Calcium: 9 mg/dL (ref 8.7–10.2)
Chloride: 101 mmol/L (ref 96–106)
Chol/HDL Ratio: 2.4 ratio (ref 0.0–4.4)
Cholesterol, Total: 177 mg/dL (ref 100–199)
Creatinine, Ser: 0.66 mg/dL (ref 0.57–1.00)
EOS (ABSOLUTE): 0.2 10*3/uL (ref 0.0–0.4)
Eos: 6 %
Estimated CHD Risk: <0.5 times avg. (ref 0.0–1.0)
Free Thyroxine Index: 2.1 (ref 1.2–4.9)
GGT: 8 IU/L (ref 0–60)
Globulin, Total: 2.3 g/dL (ref 1.5–4.5)
Glucose: 99 mg/dL (ref 65–99)
HDL: 73 mg/dL (ref >39)
Hematocrit: 42.1 % (ref 34.0–46.6)
Hemoglobin: 13.4 g/dL (ref 11.1–15.9)
Immature Grans (Abs): 0 10*3/uL (ref 0.0–0.1)
Immature Granulocytes: 0 %
Iron: 44 ug/dL (ref 27–159)
LDH: 122 IU/L (ref 119–226)
LDL Chol Calc (NIH): 95 mg/dL (ref 0–99)
Lymphocytes Absolute: 1.7 10*3/uL (ref 0.7–3.1)
Lymphs: 42 %
MCH: 27.1 pg (ref 26.6–33.0)
MCHC: 31.8 g/dL (ref 31.5–35.7)
MCV: 85 fL (ref 79–97)
Monocytes Absolute: 0.3 10*3/uL (ref 0.1–0.9)
Monocytes: 8 %
Neutrophils Absolute: 1.7 10*3/uL (ref 1.4–7.0)
Neutrophils: 42 %
Phosphorus: 4 mg/dL (ref 3.0–4.3)
Platelets: 204 10*3/uL (ref 150–450)
Potassium: 3.9 mmol/L (ref 3.5–5.2)
RBC: 4.94 x10E6/uL (ref 3.77–5.28)
RDW: 13.7 % (ref 11.7–15.4)
Sodium: 140 mmol/L (ref 134–144)
T3 Uptake Ratio: 29 % (ref 24–39)
T4, Total: 7.1 ug/dL (ref 4.5–12.0)
TSH: 1.73 u[IU]/mL (ref 0.450–4.500)
Total Protein: 6.7 g/dL (ref 6.0–8.5)
Triglycerides: 44 mg/dL (ref 0–149)
Uric Acid: 3.5 mg/dL (ref 3.0–7.2)
VLDL Cholesterol Cal: 9 mg/dL (ref 5–40)
WBC: 4 10*3/uL (ref 3.4–10.8)
eGFR: 104 mL/min/{1.73_m2} (ref >59)

## 2020-08-04 ENCOUNTER — Other Ambulatory Visit: Payer: Self-pay

## 2020-08-04 ENCOUNTER — Ambulatory Visit: Payer: Self-pay | Admitting: Physician Assistant

## 2020-08-04 ENCOUNTER — Encounter: Payer: Self-pay | Admitting: Physician Assistant

## 2020-08-04 VITALS — BP 113/72 | HR 73 | Temp 97.5°F | Resp 14 | Ht 66.0 in | Wt 128.0 lb

## 2020-08-04 DIAGNOSIS — Z0000 Encounter for general adult medical examination without abnormal findings: Secondary | ICD-10-CM

## 2020-08-04 NOTE — Progress Notes (Signed)
   Subjective: Annual physical exam    Patient ID: Sonya Burke, female    DOB: 11-27-1965, 55 y.o.   MRN: [id number]  HPI  Patient presents annual physical exam.  Patient is voices no concerns or complaints.  Review of Systems    Review of systems unremarkable. Objective:   Physical Exam No acute distress.  BP is 113/72, pulse 73, respiration 14, and temperature 97.5.  Patient O2 sat was 100% on room air.  HEENT was unremarkable.  Neck was supple for appendectomy or bruits.  Lungs are clear to auscultation.  Heart was regular rate and rhythm.  Abdomen with negative HSM, normoactive bowel sounds, soft, nontender to palpation.  No obvious deformity to cervical lumbar spine.  Patient has full and equal range of motion of the cervical lumbar spine.  Cranial nerves II through XII are grossly intact.     Assessment & Plan: Well exam.  Discussed no acute findings on labs.  Patient advised follow-up as necessary.

## 2020-08-25 ENCOUNTER — Other Ambulatory Visit: Payer: Self-pay | Admitting: Obstetrics and Gynecology

## 2020-08-25 DIAGNOSIS — Z1231 Encounter for screening mammogram for malignant neoplasm of breast: Secondary | ICD-10-CM | POA: Diagnosis not present

## 2020-08-25 DIAGNOSIS — Z01419 Encounter for gynecological examination (general) (routine) without abnormal findings: Secondary | ICD-10-CM | POA: Diagnosis not present

## 2020-09-06 ENCOUNTER — Ambulatory Visit (INDEPENDENT_AMBULATORY_CARE_PROVIDER_SITE_OTHER): Payer: 59 | Admitting: Podiatry

## 2020-09-06 ENCOUNTER — Other Ambulatory Visit: Payer: Self-pay | Admitting: Podiatry

## 2020-09-06 ENCOUNTER — Encounter: Payer: Self-pay | Admitting: Podiatry

## 2020-09-06 ENCOUNTER — Other Ambulatory Visit: Payer: Self-pay

## 2020-09-06 ENCOUNTER — Ambulatory Visit (INDEPENDENT_AMBULATORY_CARE_PROVIDER_SITE_OTHER): Payer: 59

## 2020-09-06 DIAGNOSIS — M2012 Hallux valgus (acquired), left foot: Secondary | ICD-10-CM

## 2020-09-06 DIAGNOSIS — Z9889 Other specified postprocedural states: Secondary | ICD-10-CM

## 2020-09-06 DIAGNOSIS — L603 Nail dystrophy: Secondary | ICD-10-CM

## 2020-09-06 NOTE — Progress Notes (Signed)
She presents today date of surgery 04/16/2020 states that after physical therapy her foot was doing much better she states that he still has a little bit of tenderness.  She states that can tell a big difference but is still moderately tender especially with full dorsiflexion.  She is also concerned about her hallux nail left.  Objective: Vital signs are stable she is alert oriented x3.  Pulses are palpable.  Great range of motion of the first metatarsophalangeal joint.  She has some some tenderness on palpation of the sesamoids plantarly and the joint dorsally.  Radiographs taken today demonstrate them internal fixation in good position the bone is gone on to heal 100% there is some joint space narrowing some osteoarthritic changes particularly of the sesamoids with some cystic changes as well.  This is consistent with osteoarthritic changes most likely due to malalignment prior to surgery.  Assessment: Possible onychomycosis hallux left.  Mild osteoarthritic changes first metatarsophalangeal joint status post [facility] bunion repair left.  Plan: Samples of the skin and nail were taken today from the hallux to be tested for onychomycosis and nail dystrophy's.  I also will follow up with her in 29month to discuss findings.

## 2020-09-14 ENCOUNTER — Encounter: Payer: Self-pay | Admitting: *Deleted

## 2020-09-30 ENCOUNTER — Encounter: Payer: 59 | Admitting: Dermatology

## 2020-10-04 ENCOUNTER — Ambulatory Visit (INDEPENDENT_AMBULATORY_CARE_PROVIDER_SITE_OTHER): Payer: 59 | Admitting: Podiatry

## 2020-10-04 ENCOUNTER — Other Ambulatory Visit: Payer: Self-pay

## 2020-10-04 ENCOUNTER — Encounter: Payer: Self-pay | Admitting: Podiatry

## 2020-10-04 DIAGNOSIS — L603 Nail dystrophy: Secondary | ICD-10-CM | POA: Diagnosis not present

## 2020-10-04 MED ORDER — TERBINAFINE HCL 250 MG PO TABS: 250.0000 mg | ORAL_TABLET | Freq: Every day | ORAL | 0 refills | Status: DC

## 2020-10-04 NOTE — Progress Notes (Signed)
She presents today for follow-up of her pathology results regarding her toenails.  She states that there is been no change.  Objective: Vital signs are stable alert oriented x3 there is no erythema edema cellulitis drainage or odor her toenails are unchanged.  Pathology results do demonstrate onychomycosis with saprophytic fungus.  Assessment: Saprophytic fungus.  Plan: Discussed etiology pathology and surgical therapies this point I explained to her that there is no topical that will work well for this organism I also went on to explain the pros and cons of laser as well as oral therapy.  At this point she would like to try oral therapy because of its efficacy.  She understands that the efficacy for Lamisil is anywhere from 88 to 100%.  So we are going to try that.  She is already had blood work done which demonstrated normal BUN creatinine AST and ALT levels.  We will start her on Lamisil 30 tablets 1 p.o. daily and I will follow-up with her in 1 month.  Blood work will be performed at that time consisting of a CMP as well as a prescription for 90 more Lamisil tablets.

## 2020-10-22 ENCOUNTER — Encounter: Payer: Self-pay | Admitting: Podiatry

## 2020-11-01 ENCOUNTER — Other Ambulatory Visit: Payer: Self-pay

## 2020-11-01 ENCOUNTER — Ambulatory Visit (INDEPENDENT_AMBULATORY_CARE_PROVIDER_SITE_OTHER): Payer: 59 | Admitting: Podiatry

## 2020-11-01 ENCOUNTER — Encounter: Payer: Self-pay | Admitting: Podiatry

## 2020-11-01 DIAGNOSIS — Z79899 Other long term (current) drug therapy: Secondary | ICD-10-CM | POA: Diagnosis not present

## 2020-11-01 DIAGNOSIS — L603 Nail dystrophy: Secondary | ICD-10-CM

## 2020-11-01 MED ORDER — TERBINAFINE HCL 250 MG PO TABS: 250.0000 mg | ORAL_TABLET | Freq: Every day | ORAL | 0 refills | Status: DC

## 2020-11-01 NOTE — Progress Notes (Signed)
She presents today 1 month after starting her Lamisil tablets.  She states that she notices no difference in the nail plate as of yet.  She denies fever chills nausea vomiting muscle aches pains calf pain back pain chest pain shortness of breath.  Itching or rashes.  Objective: Vital signs are stable alert oriented x3.  No change in the nail plates yet.  Assessment: Onychomycosis being treated long-term therapy Lamisil.  Plan: Starting her next 90 days of Lamisil.  Requesting complete metabolic profile.  Should this come back abnormal I will discuss this with her otherwise I will follow-up with her in 4 months

## 2020-11-02 LAB — COMPREHENSIVE METABOLIC PANEL
ALT: 8 IU/L (ref 0–32)
AST: 14 IU/L (ref 0–40)
Albumin/Globulin Ratio: 1.9 (ref 1.2–2.2)
Albumin: 4.6 g/dL (ref 3.8–4.9)
Alkaline Phosphatase: 57 IU/L (ref 44–121)
BUN/Creatinine Ratio: 23 (ref 9–23)
BUN: 18 mg/dL (ref 6–24)
Bilirubin Total: <0.2 mg/dL (ref 0.0–1.2)
CO2: 27 mmol/L (ref 20–29)
Calcium: 9.4 mg/dL (ref 8.7–10.2)
Chloride: 101 mmol/L (ref 96–106)
Creatinine, Ser: 0.78 mg/dL (ref 0.57–1.00)
Globulin, Total: 2.4 g/dL (ref 1.5–4.5)
Glucose: 135 mg/dL — ABNORMAL HIGH (ref 65–99)
Potassium: 4 mmol/L (ref 3.5–5.2)
Sodium: 140 mmol/L (ref 134–144)
Total Protein: 7 g/dL (ref 6.0–8.5)
eGFR: 90 mL/min/{1.73_m2} (ref >59)

## 2020-11-04 ENCOUNTER — Telehealth: Payer: Self-pay

## 2020-11-04 NOTE — Telephone Encounter (Signed)
-----   Message from Garrel Ridgel, [place] sent at 11/03/2020  7:38 AM EDT ----- Blood work looks good in May continue taking her medicine.  Sugar was a little bit elevated but okay if has just recently eaten.

## 2020-11-04 NOTE — Telephone Encounter (Signed)
Patient has been notified of results and instructed to continue taking Lamisil per Dr. Stephenie Acres instructions

## 2020-11-16 ENCOUNTER — Ambulatory Visit
Admission: RE | Admit: 2020-11-16 | Discharge: 2020-11-16 | Disposition: A | Discharge status: Home / Self Care | Payer: 59 | Source: Ambulatory Visit | Attending: Obstetrics and Gynecology | Admitting: Obstetrics and Gynecology

## 2020-11-16 ENCOUNTER — Other Ambulatory Visit: Payer: Self-pay

## 2020-11-16 DIAGNOSIS — Z1231 Encounter for screening mammogram for malignant neoplasm of breast: Secondary | ICD-10-CM | POA: Insufficient documentation

## 2020-12-27 ENCOUNTER — Ambulatory Visit: Payer: 59 | Admitting: Podiatry

## 2020-12-27 ENCOUNTER — Encounter: Payer: 59 | Admitting: Podiatry

## 2021-02-23 ENCOUNTER — Encounter: Payer: 59 | Admitting: Podiatry

## 2021-03-15 DIAGNOSIS — H524 Presbyopia: Secondary | ICD-10-CM | POA: Diagnosis not present

## 2021-03-15 DIAGNOSIS — H43813 Vitreous degeneration, bilateral: Secondary | ICD-10-CM | POA: Diagnosis not present

## 2021-03-16 ENCOUNTER — Encounter: Payer: 59 | Admitting: Podiatry

## 2021-04-12 DIAGNOSIS — H43811 Vitreous degeneration, right eye: Secondary | ICD-10-CM | POA: Diagnosis not present

## 2021-04-18 ENCOUNTER — Ambulatory Visit: Payer: Self-pay

## 2021-04-18 ENCOUNTER — Other Ambulatory Visit: Payer: Self-pay

## 2021-04-18 DIAGNOSIS — Z23 Encounter for immunization: Secondary | ICD-10-CM

## 2021-06-09 DIAGNOSIS — H43811 Vitreous degeneration, right eye: Secondary | ICD-10-CM | POA: Diagnosis not present

## 2021-08-02 ENCOUNTER — Other Ambulatory Visit: Payer: Self-pay

## 2021-08-02 ENCOUNTER — Ambulatory Visit: Payer: Self-pay

## 2021-08-02 DIAGNOSIS — Z780 Asymptomatic menopausal state: Secondary | ICD-10-CM

## 2021-08-02 DIAGNOSIS — Z0000 Encounter for general adult medical examination without abnormal findings: Secondary | ICD-10-CM

## 2021-08-02 LAB — POCT URINALYSIS DIPSTICK
Bilirubin, UA: NEGATIVE
Blood, UA: NEGATIVE
Glucose, UA: NEGATIVE
Ketones, UA: NEGATIVE
Nitrite, UA: NEGATIVE
Protein, UA: NEGATIVE
Spec Grav, UA: 1.01 (ref 1.010–1.025)
Urobilinogen, UA: 0.2 E.U./dL
pH, UA: 7.5 (ref 5.0–8.0)

## 2021-08-02 NOTE — Progress Notes (Signed)
08/04/21 annual physical scheduled.  ?

## 2021-08-03 LAB — CMP12+LP+TP+TSH+6AC+CBC/D/PLT
ALT: 10 IU/L (ref 0–32)
AST: 17 IU/L (ref 0–40)
Albumin/Globulin Ratio: 2.1 (ref 1.2–2.2)
Albumin: 4.4 g/dL (ref 3.8–4.9)
Alkaline Phosphatase: 57 IU/L (ref 44–121)
BUN/Creatinine Ratio: 18 (ref 9–23)
BUN: 13 mg/dL (ref 6–24)
Basophils Absolute: 0.1 10*3/uL (ref 0.0–0.2)
Basos: 2 %
Bilirubin Total: 0.3 mg/dL (ref 0.0–1.2)
Calcium: 9.2 mg/dL (ref 8.7–10.2)
Chloride: 101 mmol/L (ref 96–106)
Chol/HDL Ratio: 2.4 ratio (ref 0.0–4.4)
Cholesterol, Total: 189 mg/dL (ref 100–199)
Creatinine, Ser: 0.72 mg/dL (ref 0.57–1.00)
EOS (ABSOLUTE): 0.2 10*3/uL (ref 0.0–0.4)
Eos: 5 %
Estimated CHD Risk: <0.5 times avg. (ref 0.0–1.0)
Free Thyroxine Index: 1.7 (ref 1.2–4.9)
GGT: 8 IU/L (ref 0–60)
Globulin, Total: 2.1 g/dL (ref 1.5–4.5)
Glucose: 94 mg/dL (ref 70–99)
HDL: 80 mg/dL (ref >39)
Hematocrit: 39.4 % (ref 34.0–46.6)
Hemoglobin: 13.2 g/dL (ref 11.1–15.9)
Immature Grans (Abs): 0 10*3/uL (ref 0.0–0.1)
Immature Granulocytes: 0 %
Iron: 53 ug/dL (ref 27–159)
LDH: 131 IU/L (ref 119–226)
LDL Chol Calc (NIH): 100 mg/dL — ABNORMAL HIGH (ref 0–99)
Lymphocytes Absolute: 1.3 10*3/uL (ref 0.7–3.1)
Lymphs: 40 %
MCH: 28.1 pg (ref 26.6–33.0)
MCHC: 33.5 g/dL (ref 31.5–35.7)
MCV: 84 fL (ref 79–97)
Monocytes Absolute: 0.2 10*3/uL (ref 0.1–0.9)
Monocytes: 7 %
Neutrophils Absolute: 1.5 10*3/uL (ref 1.4–7.0)
Neutrophils: 46 %
Phosphorus: 4.3 mg/dL (ref 3.0–4.3)
Platelets: 207 10*3/uL (ref 150–450)
Potassium: 4.1 mmol/L (ref 3.5–5.2)
RBC: 4.7 x10E6/uL (ref 3.77–5.28)
RDW: 13 % (ref 11.7–15.4)
Sodium: 141 mmol/L (ref 134–144)
T3 Uptake Ratio: 25 % (ref 24–39)
T4, Total: 6.6 ug/dL (ref 4.5–12.0)
TSH: 1.58 u[IU]/mL (ref 0.450–4.500)
Total Protein: 6.5 g/dL (ref 6.0–8.5)
Triglycerides: 47 mg/dL (ref 0–149)
Uric Acid: 3.5 mg/dL (ref 3.0–7.2)
VLDL Cholesterol Cal: 9 mg/dL (ref 5–40)
WBC: 3.3 10*3/uL — ABNORMAL LOW (ref 3.4–10.8)
eGFR: 98 mL/min/{1.73_m2} (ref >59)

## 2021-08-03 LAB — FSH/LH
FSH: 107 m[IU]/mL
LH: 63.4 m[IU]/mL

## 2021-08-04 ENCOUNTER — Encounter: Payer: Self-pay | Admitting: Physician Assistant

## 2021-08-04 ENCOUNTER — Other Ambulatory Visit: Payer: Self-pay

## 2021-08-04 ENCOUNTER — Ambulatory Visit: Payer: Self-pay | Admitting: Physician Assistant

## 2021-08-04 VITALS — BP 112/70 | HR 74 | Temp 97.6°F | Resp 14 | Ht 66.0 in | Wt 132.0 lb

## 2021-08-04 DIAGNOSIS — R82998 Other abnormal findings in urine: Secondary | ICD-10-CM

## 2021-08-04 DIAGNOSIS — Z0000 Encounter for general adult medical examination without abnormal findings: Secondary | ICD-10-CM

## 2021-08-04 DIAGNOSIS — Z780 Asymptomatic menopausal state: Secondary | ICD-10-CM

## 2021-08-04 LAB — POCT URINALYSIS DIPSTICK
Bilirubin, UA: NEGATIVE
Blood, UA: NEGATIVE
Glucose, UA: NEGATIVE
Ketones, UA: NEGATIVE
Leukocytes, UA: NEGATIVE
Nitrite, UA: NEGATIVE
Protein, UA: NEGATIVE
Spec Grav, UA: 1.01 (ref 1.010–1.025)
Urobilinogen, UA: 0.2 E.U./dL
pH, UA: 7.5 (ref 5.0–8.0)

## 2021-08-04 NOTE — Addendum Note (Signed)
Addended by: Aliene Altes on: 08/04/2021 10:11 AM ? ? Modules accepted: Orders ? ?

## 2021-08-04 NOTE — Progress Notes (Signed)
Pt denies any issues today. ?

## 2021-08-04 NOTE — Progress Notes (Signed)
[facility]  ____________________________________________   None    (approximate)  I have reviewed the triage vital signs and the nursing notes.   HISTORY  Chief Complaint Annual Exam   HPI Sonya Burke is a 56 y.o. female patient presents for annual physical exam.  Patient reports no concerns or complaints.        History reviewed. No pertinent past medical history.  Patient Active Problem List   Diagnosis Date Noted   Rotator cuff syndrome 03/03/2020    Past Surgical History:  Procedure Laterality Date   BREAST EXCISIONAL BIOPSY Left 2002   benign X 2 biopsies   BUNIONECTOMY Left 04/16/2020    Prior to Admission medications   Not on File    Allergies Latex  Family History  Problem Relation Age of Onset   Breast cancer Neg Hx     Social History Social History   Tobacco Use   Smoking status: Never   Smokeless tobacco: Never  Substance Use Topics   Alcohol use: No   Drug use: Never    Review of Systems Constitutional: No fever/chills Eyes: No visual changes. ENT: No sore throat. Cardiovascular: Denies chest pain. Respiratory: Denies shortness of breath. Gastrointestinal: No abdominal pain.  No nausea, no vomiting.  No diarrhea.  No constipation. Genitourinary: Negative for dysuria. Musculoskeletal: Negative for back pain. Skin: Negative for rash. Neurological: Negative for headaches, focal weakness or numbness. Allergic/Immunilogical: Latex  ____________________________________________   PHYSICAL EXAM:  VITAL SIGNS: Temperature 97.6, pulse 74, respiration 14, BP is 112/70, and patient 100% O2 sat on room air.  Patient weighs 32 pounds and BMI is 21.31. Constitutional: Alert and oriented. Well appearing and in no acute distress. Eyes: Conjunctivae are normal. PERRL. EOMI. Head: Atraumatic. Nose: No congestion/rhinnorhea. Mouth/Throat: Mucous membranes are moist.  Oropharynx non-erythematous. Neck: No  stridor.  No cervical spine tenderness to palpation. Hematological/Lymphatic/Immunilogical: No cervical lymphadenopathy. Cardiovascular: Normal rate, regular rhythm. Grossly normal heart sounds.  Good peripheral circulation. Respiratory: Normal respiratory effort.  No retractions. Lungs CTAB. Gastrointestinal: Soft and nontender. No distention. No abdominal bruits. No CVA tenderness. Genitourinary: Deferred Musculoskeletal: No lower extremity tenderness nor edema.  No joint effusions. Neurologic:  Normal speech and language. No gross focal neurologic deficits are appreciated. No gait instability. Skin:  Skin is warm, dry and intact. No rash noted. Psychiatric: Mood and affect are normal. Speech and behavior are normal.  ____________________________________________   LABS  ____      Component Ref Range & Units 2 d ago 1 yr ago  Color, UA  yellow  yellow   Clarity, UA  clear  clear   Glucose, UA Negative Negative  Negative   Bilirubin, UA  negative  Negative   Ketones, UA  negative  Negative   Spec Grav, UA 1.010 - 1.025 1.010  1.010   Blood, UA  negative  Negative   pH, UA 5.0 - 8.0 7.5  7.5   Protein, UA Negative Negative  Positive Abnormal  CM   Urobilinogen, UA 0.2 or 1.0 E.U./dL 0.2  0.2   Nitrite, UA  negative  Negative   Leukocytes, UA Negative Moderate (2+) Abnormal   Negative   Appearance   Normal   Odor   None          Specimen Collected: 08/02/21 09:45 Last Resulted: 08/02/21 09:45      Lab Flowsheet    Order Details    View Encounter    Lab and Collection Details  Routing    Result History    Brewing technologist      CM=Additional comments      Result Care Coordination   Patient Communication  Append Comments   Seen Back to Top   Urine Culture  Written by Sable Feil, PA-C on 08/02/2021 11:10 AM EST Seen by patient [name] on 08/02/2021  8:29 PM      Other Results from 08/02/2021   Contains abnormal data  CMP12+LP+TP+TSH+6AC+CBC/D/Plt Order: [id number] Status: Final result    Visible to patient: Yes (seen)    Next appt: None    Dx: Routine adult health maintenance    0 Result Notes        Component Ref Range & Units 2 d ago (08/02/21) 9 mo ago (11/01/20) 1 yr ago (07/30/20) 1 yr ago (09/19/19)  Glucose 70 - 99 mg/dL 94  135 High  R  99 R  96 R   Uric Acid 3.0 - 7.2 mg/dL 3.5   3.5 CM  3.5 CM   Comment:            Therapeutic target for gout patients: <6.0  BUN 6 - 24 mg/dL $Remove'13  18  9  11   'mHiIIvs$ Creatinine, Ser 0.57 - 1.00 mg/dL 0.72  0.78  0.66  0.74   eGFR >59 mL/min/1.73 98  90  104 CM    BUN/Creatinine Ratio 9 - $R'23 18  23  14  15   'Pm$ Sodium 134 - 144 mmol/L 141  140  140  135   Potassium 3.5 - 5.2 mmol/L 4.1  4.0  3.9  4.1   Chloride 96 - 106 mmol/L 101  101  101  101   Calcium 8.7 - 10.2 mg/dL 9.2  9.4  9.0  8.7   Phosphorus 3.0 - 4.3 mg/dL 4.3   4.0  3.5   Total Protein 6.0 - 8.5 g/dL 6.5  7.0  6.7  6.8   Albumin 3.8 - 4.9 g/dL 4.4  4.6  4.4  4.4   Globulin, Total 1.5 - 4.5 g/dL 2.1  2.4  2.3  2.4   Albumin/Globulin Ratio 1.2 - 2.2 2.1  1.9  1.9  1.8   Bilirubin Total 0.0 - 1.2 mg/dL 0.3  <0.2  0.3  0.4   Alkaline Phosphatase 44 - 121 IU/L 57  57  55  42 R   LDH 119 - 226 IU/L 131   122  120   AST 0 - 40 IU/L $Remov'17  14  14  13   'KGUhLs$ ALT 0 - 32 IU/L $Remov'10  8  9  10   'WNskSS$ GGT 0 - 60 IU/L $Remov'8   8  9   'VFYNuH$ Iron 27 - 159 ug/dL 53   44  63   Cholesterol, Total 100 - 199 mg/dL 189   177  164   Triglycerides 0 - 149 mg/dL 47   44  40   HDL >39 mg/dL 80   73  70   VLDL Cholesterol Cal 5 - 40 mg/dL $Remove'9   9  9   'BzzgJLH$ LDL Chol Calc (NIH) 0 - 99 mg/dL 100 High    95  85   Chol/HDL Ratio 0.0 - 4.4 ratio 2.4   2.4 CM  2.3 CM   Comment:  T. Chol/HDL Ratio                                              Men  Women                                1/2 Avg.Risk  3.4    3.3                                    Avg.Risk  5.0    4.4                                 2X Avg.Risk  9.6    7.1                                  3X Avg.Risk 23.4   11.0   Estimated CHD Risk 0.0 - 1.0 times avg.  < 0.5    < 0.5 CM   < 0.5 CM   Comment: The CHD Risk is based on the T. Chol/HDL ratio. Other  factors affect CHD Risk such as hypertension, smoking,  diabetes, severe obesity, and family history of  premature CHD.   TSH 0.450 - 4.500 uIU/mL 1.580   1.730  1.890   T4, Total 4.5 - 12.0 ug/dL 6.6   7.1  7.2   T3 Uptake Ratio 24 - 39 % $Re'25   29  27   'Kve$ Free Thyroxine Index 1.2 - 4.9 1.7   2.1  1.9   WBC 3.4 - 10.8 x10E3/uL 3.3 Low    4.0  3.1 Low    RBC 3.77 - 5.28 x10E6/uL 4.70   4.94  4.49   Hemoglobin 11.1 - 15.9 g/dL 13.2   13.4  12.9   Hematocrit 34.0 - 46.6 % 39.4   42.1  39.2   MCV 79 - 97 fL 84   85  87   MCH 26.6 - 33.0 pg 28.1   27.1  28.7   MCHC 31.5 - 35.7 g/dL 33.5   31.8  32.9   RDW 11.7 - 15.4 % 13.0   13.7  12.8   Platelets 150 - 450 x10E3/uL 207   204  246   Neutrophils Not Estab. % 46   42  46   Lymphs Not Estab. % 40   42  41   Monocytes Not Estab. % $Remove'7   8  7   'NwRxCbK$ Eos Not Estab. % $Remove'5   6  4   'kmQHPEL$ Basos Not Estab. % $Remove'2   2  2   'NeVXSMJ$ Neutrophils Absolute 1.4 - 7.0 x10E3/uL 1.5   1.7  1.4   Lymphocytes Absolute 0.7 - 3.1 x10E3/uL 1.3   1.7  1.2   Monocytes Absolute 0.1 - 0.9 x10E3/uL 0.2   0.3  0.2   EOS (ABSOLUTE) 0.0 - 0.4 x10E3/uL 0.2   0.2  0.1   Basophils Absolute 0.0 - 0.2 x10E3/uL 0.1   0.1  0.1   Immature Granulocytes Not Estab. % 0   0  0   Immature Grans (Abs) 0.0 - 0.1 x10E3/uL 0.0  0.0  0.0   Resulting Agency  [facility]       Narrative Performed by: Verdell Carmine Performed at:  [address] [facility]   [address], [place], [place]  [id number]  Lab Director: Jolene Schimke MD, Phone:  [phone]    Specimen Collected: 08/02/21 09:10 Last Resulted: 08/03/21 08:14      Lab Flowsheet    Order Details    View Encounter    Lab and Collection Details    Routing    Result History    View Encounter Conversation      CM=Additional comments  R=Reference range differs  from displayed range      Result Care Coordination   Patient Communication   Add Comments   Seen Back to Top         [facility] Order: [id number] Status: Final result    Visible to patient: Yes (seen)    Next appt: None    Dx: Menopause    0 Result Notes     Component Ref Range & Units 2 d ago  LH mIU/mL 63.4   Comment:                     Adult Female:                        Follicular phase      2.4 -  12.6                        Ovulation phase      14.0 -  95.6                        Luteal phase          1.0 -  11.4                        Postmenopausal        7.7 -  58.5   FSH mIU/mL 107.0   Comment:                     Adult Female:                        Follicular phase      3.5 -  12.5                        Ovulation phase       4.7 -  21.5                        Luteal phase          1.7 -   7.7               ________________________________________  EKG Sinus  Rhythm at 60 bpm. -  Negative precordial T-waves  -Probably normal -consider anteroseptal ischemia.   PROBABLY NORMAL FOR AGE  ____________________________________________    ____________________________________________   INITIAL IMPRESSION / ASSESSMENT AND PLAN As part of my medical decision making, I reviewed the following data within the electronic MEDICAL RECORD NUMBER       Discussed lab results and EKG findings with patient.  Did a repeat urinalysis because the previous one showed 2+ leukocytes few days ago.  UA today was  normal.      ____________________________________________   FINAL CLINICAL IMPRESSION Well exam   ED Discharge Orders     None        Note:  This document was prepared using Dragon voice recognition software and may include unintentional dictation errors.

## 2021-08-08 ENCOUNTER — Other Ambulatory Visit: Payer: Self-pay | Admitting: Obstetrics and Gynecology

## 2021-08-08 DIAGNOSIS — Z1231 Encounter for screening mammogram for malignant neoplasm of breast: Secondary | ICD-10-CM | POA: Diagnosis not present

## 2021-08-08 DIAGNOSIS — Z01419 Encounter for gynecological examination (general) (routine) without abnormal findings: Secondary | ICD-10-CM | POA: Diagnosis not present

## 2021-08-08 DIAGNOSIS — Z1331 Encounter for screening for depression: Secondary | ICD-10-CM | POA: Diagnosis not present

## 2021-08-08 DIAGNOSIS — N898 Other specified noninflammatory disorders of vagina: Secondary | ICD-10-CM | POA: Diagnosis not present

## 2021-10-25 DIAGNOSIS — H43811 Vitreous degeneration, right eye: Secondary | ICD-10-CM | POA: Diagnosis not present

## 2021-11-21 ENCOUNTER — Ambulatory Visit
Admission: RE | Admit: 2021-11-21 | Discharge: 2021-11-21 | Disposition: A | Discharge status: Home / Self Care | Payer: 59 | Source: Ambulatory Visit | Attending: Obstetrics and Gynecology | Admitting: Obstetrics and Gynecology

## 2021-11-21 DIAGNOSIS — Z1231 Encounter for screening mammogram for malignant neoplasm of breast: Secondary | ICD-10-CM | POA: Insufficient documentation

## 2021-12-14 ENCOUNTER — Encounter: Payer: Self-pay | Admitting: Physician Assistant

## 2021-12-14 ENCOUNTER — Ambulatory Visit: Payer: 59 | Admitting: Physician Assistant

## 2021-12-14 DIAGNOSIS — R8290 Unspecified abnormal findings in urine: Secondary | ICD-10-CM

## 2021-12-14 DIAGNOSIS — R300 Dysuria: Secondary | ICD-10-CM

## 2021-12-14 LAB — POCT URINALYSIS DIPSTICK
Bilirubin, UA: NEGATIVE
Glucose, UA: NEGATIVE
Ketones, UA: NEGATIVE
Nitrite, UA: NEGATIVE
Protein, UA: NEGATIVE
Spec Grav, UA: 1.01 (ref 1.010–1.025)
Urobilinogen, UA: 0.2 E.U./dL
pH, UA: 7.5 (ref 5.0–8.0)

## 2021-12-14 MED ORDER — NITROFURANTOIN MONOHYD MACRO 100 MG PO CAPS: 100.0000 mg | ORAL_CAPSULE | Freq: Two times a day (BID) | ORAL | 0 refills | Status: DC

## 2021-12-14 MED ORDER — PHENAZOPYRIDINE HCL 200 MG PO TABS: 200.0000 mg | ORAL_TABLET | Freq: Three times a day (TID) | ORAL | 0 refills | Status: DC | PRN

## 2021-12-14 NOTE — Progress Notes (Signed)
   Subjective: Dysuria    Patient ID: Sonya Burke, female    DOB: [birth year], 56 y.o.   MRN: [id number]  HPI Patient presents with 2 days of dysuria.  Patient states urinary frequency and urgency.  Denies vaginal discharge, fever, or flank pain.  Review of Systems Negative except for chief complaint    Objective:   Physical Exam  This was a televisit.  Dropped off a urine sample prior to this interview. Urine shows 2+ leukocytes and was sent off for cultures.      Assessment & Plan: UTI   Patient prescribed Macrobid and Pyridium.  We will follow-up with urine culture results.

## 2021-12-14 NOTE — Progress Notes (Signed)
Pt c/o of painful urination and frequency. Urine dipstick performed. Pending urine culture results.

## 2021-12-17 LAB — URINE CULTURE

## 2022-01-03 ENCOUNTER — Encounter: Payer: Self-pay | Admitting: Physician Assistant

## 2022-01-03 ENCOUNTER — Ambulatory Visit: Payer: Self-pay | Admitting: Physician Assistant

## 2022-01-03 DIAGNOSIS — R300 Dysuria: Secondary | ICD-10-CM

## 2022-01-03 LAB — POCT URINALYSIS DIPSTICK
Bilirubin, UA: NEGATIVE
Blood, UA: POSITIVE
Glucose, UA: NEGATIVE
Ketones, UA: NEGATIVE
Nitrite, UA: NEGATIVE
Protein, UA: NEGATIVE
Spec Grav, UA: 1.01 (ref 1.010–1.025)
Urobilinogen, UA: 0.2 E.U./dL
pH, UA: 6.5 (ref 5.0–8.0)

## 2022-01-03 MED ORDER — SULFAMETHOXAZOLE-TRIMETHOPRIM 800-160 MG PO TABS: 1.0000 | ORAL_TABLET | Freq: Two times a day (BID) | ORAL | 0 refills | Status: DC

## 2022-01-03 MED ORDER — PHENAZOPYRIDINE HCL 200 MG PO TABS: 200.0000 mg | ORAL_TABLET | Freq: Three times a day (TID) | ORAL | 0 refills | Status: DC | PRN

## 2022-01-03 NOTE — Progress Notes (Signed)
   Subjective: UTI    Patient ID: Sonya Burke, female    DOB: [birth year], 56 y.o.   MRN: [id number]  HPI Patient here today for dysuria, frequency, and urgency.  Patient was treated for UTI with 12/14/2021.  Based on culture and sensitivity patient placed on Macrobid.  Patient states she took the medication as directed but remembered Macrobid was refractory to previous UTIs.  Patient denies vaginal discharge fever, or flank pain.   Review of Systems Past medical history negative except for chief complaint.    Objective:   Physical Exam 0 Result Notes         Component Ref Range & Units 14:23 (01/03/22) 2 wk ago (12/14/21) 5 mo ago (08/04/21) 5 mo ago (08/02/21) 1 yr ago (07/30/20)  Color, UA  pale yellow  pale yellow  Bright Yellow  yellow  yellow   Clarity, UA  cloudy  clear  Clear  clear  clear   Glucose, UA Negative Negative  Negative  Negative  Negative  Negative   Bilirubin, UA  negative  negative  Negative  negative  Negative   Ketones, UA  negative  negative  Negative  negative  Negative   Spec Grav, UA 1.010 - 1.025 1.010  1.010  1.010  1.010  1.010   Blood, UA  positive  3+ CM  Negative  negative  Negative   Comment: -+  pH, UA 5.0 - 8.0 6.5  7.5  7.5  7.5  7.5   Protein, UA Negative Negative  Negative  Negative  Negative  Positive Abnormal  CM   Urobilinogen, UA 0.2 or 1.0 E.U./dL 0.2  0.2  0.2  0.2  0.2   Nitrite, UA  negative  negative  Negative  negative  Negative   Leukocytes, UA Negative Large (3+) Abnormal   Moderate (2+) Abnormal  CM  Negative  Moderate (2+) Abnormal   Negative   Appearance   yellow clear      Normal                    Assessment & Plan: UTI  Patient given a prescription for Bactrim DS and Pyridium.  Urine culture sent.  Patient advised to follow-up in 10 days.

## 2022-01-03 NOTE — Progress Notes (Signed)
Pt has had one day of painful urination. Hx of macrobid does not work for pt.

## 2022-01-05 LAB — URINE CULTURE

## 2022-01-23 ENCOUNTER — Other Ambulatory Visit: Payer: Self-pay

## 2022-01-23 DIAGNOSIS — R8290 Unspecified abnormal findings in urine: Secondary | ICD-10-CM

## 2022-01-23 LAB — POCT URINALYSIS DIPSTICK
Bilirubin, UA: NEGATIVE
Blood, UA: NEGATIVE
Glucose, UA: NEGATIVE
Ketones, UA: NEGATIVE
Leukocytes, UA: NEGATIVE
Nitrite, UA: NEGATIVE
Protein, UA: NEGATIVE
Spec Grav, UA: 1.01 (ref 1.010–1.025)
Urobilinogen, UA: 0.2 E.U./dL
pH, UA: 7 (ref 5.0–8.0)

## 2022-01-23 NOTE — Progress Notes (Signed)
Repeat UA completed and negative for abnormalities.

## 2022-06-01 ENCOUNTER — Other Ambulatory Visit: Payer: Self-pay | Admitting: Physician Assistant

## 2022-06-01 ENCOUNTER — Other Ambulatory Visit: Payer: Self-pay

## 2022-06-01 DIAGNOSIS — R350 Frequency of micturition: Secondary | ICD-10-CM

## 2022-06-01 LAB — POCT URINALYSIS DIPSTICK
Bilirubin, UA: NEGATIVE
Glucose, UA: NEGATIVE
Ketones, UA: NEGATIVE
Nitrite, UA: NEGATIVE
Protein, UA: NEGATIVE
Spec Grav, UA: <=1.005 — AB (ref 1.010–1.025)
Urobilinogen, UA: 0.2 E.U./dL
pH, UA: 7 (ref 5.0–8.0)

## 2022-06-01 MED ORDER — PHENAZOPYRIDINE HCL 200 MG PO TABS: 200.0000 mg | ORAL_TABLET | Freq: Three times a day (TID) | ORAL | 0 refills | Status: DC | PRN

## 2022-06-01 MED ORDER — SULFAMETHOXAZOLE-TRIMETHOPRIM 800-160 MG PO TABS: 1.0000 | ORAL_TABLET | Freq: Two times a day (BID) | ORAL | 0 refills | Status: DC

## 2022-06-01 NOTE — Progress Notes (Signed)
Pt presents today with burning sensation with void, pressure and frequency

## 2022-06-05 LAB — URINE CULTURE

## 2022-07-21 ENCOUNTER — Ambulatory Visit: Payer: Self-pay

## 2022-07-21 DIAGNOSIS — Z780 Asymptomatic menopausal state: Secondary | ICD-10-CM

## 2022-07-21 DIAGNOSIS — Z0000 Encounter for general adult medical examination without abnormal findings: Secondary | ICD-10-CM

## 2022-07-21 LAB — POCT URINALYSIS DIPSTICK
Bilirubin, UA: NEGATIVE
Blood, UA: NEGATIVE
Glucose, UA: NEGATIVE
Ketones, UA: NEGATIVE
Leukocytes, UA: NEGATIVE
Nitrite, UA: NEGATIVE
Protein, UA: NEGATIVE
Spec Grav, UA: 1.01 (ref 1.010–1.025)
Urobilinogen, UA: 0.2 E.U./dL
pH, UA: 7.5 (ref 5.0–8.0)

## 2022-07-21 NOTE — Progress Notes (Signed)
Pt presents today to complete labs for physical. Sonya Burke

## 2022-07-26 ENCOUNTER — Encounter: Payer: Self-pay | Admitting: Physician Assistant

## 2022-07-26 ENCOUNTER — Ambulatory Visit: Payer: Self-pay | Admitting: Physician Assistant

## 2022-07-26 VITALS — BP 111/70 | HR 68 | Resp 12 | Ht 66.0 in | Wt 132.0 lb

## 2022-07-26 DIAGNOSIS — Z0000 Encounter for general adult medical examination without abnormal findings: Secondary | ICD-10-CM

## 2022-07-26 NOTE — Progress Notes (Signed)
Pt presents today to complete physical. Pt denies any issues or concerns at this time./CL,RMA

## 2022-07-26 NOTE — Progress Notes (Signed)
[facility]   ____________________________________________   None    (approximate)  I have reviewed the triage vital signs and the nursing notes.   HISTORY  Chief Complaint No chief complaint on file.   HPI Sonya Burke is a 57 y.o. female patient presents for annual physical exam.  Voices no concerns or complaints.         No past medical history on file.  Patient Active Problem List   Diagnosis Date Noted   Rotator cuff syndrome 03/03/2020    Past Surgical History:  Procedure Laterality Date   BREAST EXCISIONAL BIOPSY Left 2002   benign X 2 biopsies   BUNIONECTOMY Left 04/16/2020    Prior to Admission medications   Medication Sig Start Date End Date Taking? Authorizing Provider  phenazopyridine (PYRIDIUM) 200 MG tablet Take 1 tablet (200 mg total) by mouth 3 (three) times daily as needed for pain. 06/01/22   Sable Feil, PA-C  sulfamethoxazole-trimethoprim (BACTRIM DS) 800-160 MG tablet Take 1 tablet by mouth 2 (two) times daily. 06/01/22   Sable Feil, PA-C  VAGIFEM 10 MCG TABS vaginal tablet Place 1 tablet vaginally 2 (two) times a week. 12/13/21   [provider]    Allergies Latex  Family History  Problem Relation Age of Onset   Breast cancer Neg Hx     Social History Social History   Tobacco Use   Smoking status: Never   Smokeless tobacco: Never  Substance Use Topics   Alcohol use: No   Drug use: Never    Review of Systems Constitutional: No fever/chills Eyes: No visual changes. ENT: No sore throat. Cardiovascular: Denies chest pain. Respiratory: Denies shortness of breath. Gastrointestinal: No abdominal pain.  No nausea, no vomiting.  No diarrhea.  No constipation. Genitourinary: Negative for dysuria. Musculoskeletal: Negative for back pain. Skin: Negative for rash. Neurological: Negative for headaches, focal weakness or numbness.  Allergic/Immunilogical:  Latex ____________________________________________   PHYSICAL EXAM: VITAL SIGNS: 110/70, pulse 68, respiration 12, patient 100% O2 sat on room air.  Patient with 132 pounds and BMI is 21.31. Constitutional: Alert and oriented. Well appearing and in no acute distress. Eyes: Conjunctivae are normal. PERRL. EOMI. Head: Atraumatic. Nose: No congestion/rhinnorhea. Mouth/Throat: Mucous membranes are moist.  Oropharynx non-erythematous. Neck: No stridor.  No cervical spine tenderness to palpation. Hematological/Lymphatic/Immunilogical: No cervical lymphadenopathy. Cardiovascular: Normal rate, regular rhythm. Grossly normal heart sounds.  Good peripheral circulation. Respiratory: Normal respiratory effort.  No retractions. Lungs CTAB. Gastrointestinal: Soft and nontender. No distention. No abdominal bruits. No CVA tenderness. Genitourinary: Deferred Musculoskeletal: No lower extremity tenderness nor edema.  No joint effusions. Neurologic:  Normal speech and language. No gross focal neurologic deficits are appreciated. No gait instability. Skin:  Skin is warm, dry and intact. No rash noted. Psychiatric: Mood and affect are normal. Speech and behavior are normal.  ____________________________________________   LABS _Pending ___________________________________________  EKG Sinus rhythm at 60 bpm  ____________________________________________    ____________________________________________   INITIAL IMPRESSION / ASSESSMENT AND PLAN   As part of my medical decision making, I reviewed the following data within the electronic MEDICAL RECORD NUMBER       No acute findings on EKG and physical exam.  Labs are pending.     ____________________________________________   FINAL CLINICAL IMPRESSION Well exam    ED Discharge Orders     None        Note:  This document was prepared using Dragon voice recognition software and  may include unintentional dictation errors.

## 2022-07-27 LAB — CMP12+LP+TP+TSH+6AC+CBC/D/PLT
ALT: 9 IU/L (ref 0–32)
AST: 14 IU/L (ref 0–40)
Albumin/Globulin Ratio: 2.2 (ref 1.2–2.2)
Albumin: 4.6 g/dL (ref 3.8–4.9)
Alkaline Phosphatase: 69 IU/L (ref 44–121)
BUN/Creatinine Ratio: 18 (ref 9–23)
BUN: 12 mg/dL (ref 6–24)
Basophils Absolute: 0 10*3/uL (ref 0.0–0.2)
Basos: 1 %
Bilirubin Total: 0.3 mg/dL (ref 0.0–1.2)
Calcium: 9.4 mg/dL (ref 8.7–10.2)
Chloride: 100 mmol/L (ref 96–106)
Chol/HDL Ratio: 2.4 ratio (ref 0.0–4.4)
Cholesterol, Total: 188 mg/dL (ref 100–199)
Creatinine, Ser: 0.67 mg/dL (ref 0.57–1.00)
EOS (ABSOLUTE): 0.2 10*3/uL (ref 0.0–0.4)
Eos: 6 %
Estimated CHD Risk: <0.5 times avg. (ref 0.0–1.0)
Free Thyroxine Index: 2 (ref 1.2–4.9)
GGT: 8 IU/L (ref 0–60)
Globulin, Total: 2.1 g/dL (ref 1.5–4.5)
Glucose: 90 mg/dL (ref 70–99)
HDL: 80 mg/dL (ref >39)
Hematocrit: 37 % (ref 34.0–46.6)
Hemoglobin: 12.1 g/dL (ref 11.1–15.9)
Immature Grans (Abs): 0 10*3/uL (ref 0.0–0.1)
Immature Granulocytes: 0 %
Iron: 46 ug/dL (ref 27–159)
LDH: 138 IU/L (ref 119–226)
LDL Chol Calc (NIH): 99 mg/dL (ref 0–99)
Lymphocytes Absolute: 1.3 10*3/uL (ref 0.7–3.1)
Lymphs: 44 %
MCH: 28 pg (ref 26.6–33.0)
MCHC: 32.7 g/dL (ref 31.5–35.7)
MCV: 86 fL (ref 79–97)
Monocytes Absolute: 0.2 10*3/uL (ref 0.1–0.9)
Monocytes: 8 %
Neutrophils Absolute: 1.2 10*3/uL — ABNORMAL LOW (ref 1.4–7.0)
Neutrophils: 41 %
Phosphorus: 4.1 mg/dL (ref 3.0–4.3)
Platelets: 263 10*3/uL (ref 150–450)
Potassium: 4.3 mmol/L (ref 3.5–5.2)
RBC: 4.32 x10E6/uL (ref 3.77–5.28)
RDW: 13.6 % (ref 11.7–15.4)
Sodium: 141 mmol/L (ref 134–144)
T3 Uptake Ratio: 28 % (ref 24–39)
T4, Total: 7.2 ug/dL (ref 4.5–12.0)
TSH: 2.33 u[IU]/mL (ref 0.450–4.500)
Total Protein: 6.7 g/dL (ref 6.0–8.5)
Triglycerides: 44 mg/dL (ref 0–149)
Uric Acid: 3.1 mg/dL (ref 3.0–7.2)
VLDL Cholesterol Cal: 9 mg/dL (ref 5–40)
WBC: 3 10*3/uL — ABNORMAL LOW (ref 3.4–10.8)
eGFR: 102 mL/min/{1.73_m2} (ref >59)

## 2022-07-27 LAB — ESTROGENS, TOTAL: Estrogen: 55 pg/mL (ref 40–244)

## 2022-07-27 LAB — TESTOSTERONE, TOTAL, LC/MS/MS: Testosterone, total: 22.4 ng/dL

## 2022-07-27 LAB — PROGESTERONE: Progesterone: 0.2 ng/mL

## 2022-08-16 ENCOUNTER — Other Ambulatory Visit: Payer: Self-pay | Admitting: Obstetrics and Gynecology

## 2022-08-16 DIAGNOSIS — Z01411 Encounter for gynecological examination (general) (routine) with abnormal findings: Secondary | ICD-10-CM | POA: Diagnosis not present

## 2022-08-16 DIAGNOSIS — Z1331 Encounter for screening for depression: Secondary | ICD-10-CM | POA: Diagnosis not present

## 2022-08-16 DIAGNOSIS — N951 Menopausal and female climacteric states: Secondary | ICD-10-CM | POA: Diagnosis not present

## 2022-08-16 DIAGNOSIS — Z1231 Encounter for screening mammogram for malignant neoplasm of breast: Secondary | ICD-10-CM

## 2022-09-19 DIAGNOSIS — H524 Presbyopia: Secondary | ICD-10-CM | POA: Diagnosis not present

## 2022-11-16 ENCOUNTER — Other Ambulatory Visit: Payer: Self-pay

## 2022-11-16 DIAGNOSIS — Z780 Asymptomatic menopausal state: Secondary | ICD-10-CM

## 2022-11-16 NOTE — Progress Notes (Signed)
Pt presents today for outside lab work for gynecology.

## 2022-11-17 LAB — HEPATIC FUNCTION PANEL
ALT: 7 IU/L (ref 0–32)
AST: 19 IU/L (ref 0–40)
Albumin: 4.2 g/dL (ref 3.8–4.9)
Alkaline Phosphatase: 63 IU/L (ref 44–121)
Bilirubin Total: 0.4 mg/dL (ref 0.0–1.2)
Bilirubin, Direct: 0.13 mg/dL (ref 0.00–0.40)
Total Protein: 6.5 g/dL (ref 6.0–8.5)

## 2022-11-17 LAB — ESTRADIOL: Estradiol: 83.1 pg/mL

## 2022-11-17 LAB — PROGESTERONE: Progesterone: 1.9 ng/mL

## 2022-11-17 LAB — DHEA-SULFATE: DHEA-SO4: 97.9 ug/dL (ref 29.4–220.5)

## 2022-11-23 ENCOUNTER — Ambulatory Visit
Admission: RE | Admit: 2022-11-23 | Discharge: 2022-11-23 | Disposition: A | Discharge status: Home / Self Care | Payer: 59 | Source: Ambulatory Visit | Attending: Obstetrics and Gynecology | Admitting: Obstetrics and Gynecology

## 2022-11-23 DIAGNOSIS — Z1231 Encounter for screening mammogram for malignant neoplasm of breast: Secondary | ICD-10-CM | POA: Insufficient documentation

## 2023-07-13 ENCOUNTER — Ambulatory Visit: Payer: Self-pay

## 2023-07-13 DIAGNOSIS — Z0000 Encounter for general adult medical examination without abnormal findings: Secondary | ICD-10-CM

## 2023-07-13 DIAGNOSIS — Z780 Asymptomatic menopausal state: Secondary | ICD-10-CM

## 2023-07-13 LAB — POCT URINALYSIS DIPSTICK
Bilirubin, UA: NEGATIVE
Blood, UA: NEGATIVE
Glucose, UA: NEGATIVE
Ketones, UA: NEGATIVE
Leukocytes, UA: NEGATIVE
Nitrite, UA: NEGATIVE
Protein, UA: NEGATIVE
Spec Grav, UA: <=1.005 — AB (ref 1.010–1.025)
Urobilinogen, UA: 0.2 U/dL
pH, UA: 7.5 (ref 5.0–8.0)

## 2023-07-13 NOTE — Progress Notes (Signed)
Pt completed labs for OBGYN

## 2023-07-16 LAB — CMP12+LP+TP+TSH+6AC+CBC/D/PLT
ALT: 11 [IU]/L (ref 0–32)
AST: 15 [IU]/L (ref 0–40)
Albumin: 4.3 g/dL (ref 3.8–4.9)
Alkaline Phosphatase: 45 [IU]/L (ref 44–121)
BUN/Creatinine Ratio: 25 — ABNORMAL HIGH (ref 9–23)
BUN: 14 mg/dL (ref 6–24)
Basophils Absolute: 0.1 10*3/uL (ref 0.0–0.2)
Basos: 1 %
Bilirubin Total: 0.3 mg/dL (ref 0.0–1.2)
Calcium: 8.9 mg/dL (ref 8.7–10.2)
Chloride: 103 mmol/L (ref 96–106)
Chol/HDL Ratio: 2.2 {ratio} (ref 0.0–4.4)
Cholesterol, Total: 177 mg/dL (ref 100–199)
Creatinine, Ser: 0.55 mg/dL — ABNORMAL LOW (ref 0.57–1.00)
EOS (ABSOLUTE): 0.3 10*3/uL (ref 0.0–0.4)
Eos: 8 %
Estimated CHD Risk: <0.5 times avg. (ref 0.0–1.0)
Free Thyroxine Index: 1.8 (ref 1.2–4.9)
GGT: 8 [IU]/L (ref 0–60)
Globulin, Total: 2 g/dL (ref 1.5–4.5)
Glucose: 88 mg/dL (ref 70–99)
HDL: 79 mg/dL (ref >39)
Hematocrit: 40 % (ref 34.0–46.6)
Hemoglobin: 12.9 g/dL (ref 11.1–15.9)
Immature Grans (Abs): 0 10*3/uL (ref 0.0–0.1)
Immature Granulocytes: 0 %
Iron: 62 ug/dL (ref 27–159)
LDH: 116 [IU]/L — ABNORMAL LOW (ref 119–226)
LDL Chol Calc (NIH): 89 mg/dL (ref 0–99)
Lymphocytes Absolute: 1.7 10*3/uL (ref 0.7–3.1)
Lymphs: 43 %
MCH: 28.8 pg (ref 26.6–33.0)
MCHC: 32.3 g/dL (ref 31.5–35.7)
MCV: 89 fL (ref 79–97)
Monocytes Absolute: 0.3 10*3/uL (ref 0.1–0.9)
Monocytes: 8 %
Neutrophils Absolute: 1.6 10*3/uL (ref 1.4–7.0)
Neutrophils: 40 %
Phosphorus: 3.8 mg/dL (ref 3.0–4.3)
Platelets: 234 10*3/uL (ref 150–450)
Potassium: 4.2 mmol/L (ref 3.5–5.2)
RBC: 4.48 x10E6/uL (ref 3.77–5.28)
RDW: 13 % (ref 11.7–15.4)
Sodium: 140 mmol/L (ref 134–144)
T3 Uptake Ratio: 27 % (ref 24–39)
T4, Total: 6.7 ug/dL (ref 4.5–12.0)
TSH: 1.9 u[IU]/mL (ref 0.450–4.500)
Total Protein: 6.3 g/dL (ref 6.0–8.5)
Triglycerides: 41 mg/dL (ref 0–149)
Uric Acid: 2.9 mg/dL — ABNORMAL LOW (ref 3.0–7.2)
VLDL Cholesterol Cal: 9 mg/dL (ref 5–40)
WBC: 4 10*3/uL (ref 3.4–10.8)
eGFR: 106 mL/min/{1.73_m2} (ref >59)

## 2023-07-16 LAB — DHEA-SULFATE: DHEA-SO4: 83.6 ug/dL (ref 29.4–220.5)

## 2023-07-16 LAB — ESTRADIOL: Estradiol: 71.8 pg/mL

## 2023-07-16 LAB — TESTOSTERONE, TOTAL, LC/MS/MS: Testosterone, total: 18.4 ng/dL

## 2023-07-16 LAB — PROGESTERONE: Progesterone: 1.3 ng/mL

## 2023-07-19 ENCOUNTER — Encounter: Payer: Self-pay | Admitting: Physician Assistant

## 2023-07-19 ENCOUNTER — Ambulatory Visit: Payer: Self-pay | Admitting: Physician Assistant

## 2023-07-19 ENCOUNTER — Encounter: Payer: 59 | Admitting: Physician Assistant

## 2023-07-19 VITALS — BP 107/67 | HR 70 | Temp 97.8°F | Resp 16 | Ht 66.0 in | Wt 131.0 lb

## 2023-07-19 DIAGNOSIS — Z79890 Hormone replacement therapy (postmenopausal): Secondary | ICD-10-CM

## 2023-07-19 DIAGNOSIS — Z0000 Encounter for general adult medical examination without abnormal findings: Secondary | ICD-10-CM

## 2023-07-19 NOTE — Progress Notes (Signed)
 Here for annual physical-no complaints voiced.  Has annual GYN in April.

## 2023-07-19 NOTE — Progress Notes (Signed)
 [facility]  ____________________________________________   None    (approximate)  I have reviewed the triage vital signs and the nursing notes.   HISTORY  Chief Complaint No chief complaint on file.   HPI Sonya Burke is a 58 y.o. female patient presents for annual physical exam.  Request bone density test secondary to hormonal replacement        No past medical history on file.  Patient Active Problem List   Diagnosis Date Noted   Rotator cuff syndrome 03/03/2020    Past Surgical History:  Procedure Laterality Date   BREAST EXCISIONAL BIOPSY Left 2002   benign X 2 biopsies   BUNIONECTOMY Left 04/16/2020    Prior to Admission medications   Not on File    Allergies Latex  Family History  Problem Relation Age of Onset   Breast cancer Neg Hx     Social History Social History   Tobacco Use   Smoking status: Never   Smokeless tobacco: Never  Substance Use Topics   Alcohol use: No   Drug use: Never    Review of Systems Constitutional: No fever/chills Eyes: No visual changes. ENT: No sore throat. Cardiovascular: Denies chest pain. Respiratory: Denies shortness of breath. Gastrointestinal: No abdominal pain.  No nausea, no vomiting.  No diarrhea.  No constipation. Genitourinary: Negative for dysuria. Musculoskeletal: Negative for back pain. Skin: Negative for rash. Neurological: Negative for headaches, focal weakness or numbness. Endocrine: Hormonal replacement Allergic/Immunilogical: Latex  ____________________________________________   PHYSICAL EXAM:  VITAL SIGNS:  07/19/2023   11:36 AM    BP 107/67  Pulse Rate 70  Temp 97.8 F (36.6 C)  Weight 131 lb (59.4 kg)  Height 5\' 6"  (1.676 m)  Resp 16  SpO2 98 %   BMI: 21.14 kg/m2  BSA: 1.66 m2   Constitutional: Alert and oriented. Well appearing and in no acute distress. Eyes: Conjunctivae are normal. PERRL. EOMI. Head:  Atraumatic. Nose: No congestion/rhinnorhea. Mouth/Throat: Mucous membranes are moist.  Oropharynx non-erythematous. Neck: No stridor.  No cervical spine tenderness to palpation. Hematological/Lymphatic/Immunilogical: No cervical lymphadenopathy. Cardiovascular: Normal rate, regular rhythm. Grossly normal heart sounds.  Good peripheral circulation. Respiratory: Normal respiratory effort.  No retractions. Lungs CTAB. Gastrointestinal: Soft and nontender. No distention. No abdominal bruits. No CVA tenderness. Genitourinary: Deferred Musculoskeletal: No lower extremity tenderness nor edema.  No joint effusions. Neurologic:  Normal speech and language. No gross focal neurologic deficits are appreciated. No gait instability. Skin:  Skin is warm, dry and intact. No rash noted. Psychiatric: Mood and affect are normal. Speech and behavior are normal.  ____________________________________________   LABS ___     Component Ref Range & Units (hover) 6 d ago 12 mo ago  Testosterone, total 18.4 22.4 CM  Comment:                          Female:                           Premenopausal    10.0 - 55.0                           Postmenopausal    7.0 - 40.0  [facility]ing Agency [facility]          View All Conversations on this Encounter  Component Ref Range & Units (hover) 6 d ago (07/13/23) 8 mo ago (11/16/22) 12 mo ago (07/21/22) 1 yr ago (08/02/21) 2 yr ago (11/01/20) 2 yr ago (07/30/20) 3 yr ago (09/19/19)  Glucose 88  90 94 135 High  R 99 R 96 R  Uric Acid 2.9 Low   3.1 CM 3.5 CM  3.5 CM 3.5 CM  Comment:            Therapeutic target for gout patients: <6.0  BUN 14  12 13 18 9 11   Creatinine, Ser 0.55 Low   0.67 0.72 0.78 0.66 0.74  eGFR 106  102 98 90 104 CM   BUN/Creatinine Ratio 25 High   18 18 23 14 15   Sodium 140  141 141 140 140 135  Potassium 4.2  4.3 4.1 4.0 3.9 4.1  Chloride 103  100 101 101 101 101  Calcium 8.9  9.4 9.2 9.4 9.0 8.7  Phosphorus 3.8  4.1 4.3  4.0  3.5  Total Protein 6.3 6.5 6.7 6.5 7.0 6.7 6.8  Albumin 4.3 4.2 4.6 4.4 4.6 4.4 4.4  Globulin, Total 2.0  2.1 2.1 2.4 2.3 2.4  Bilirubin Total 0.3 0.4 0.3 0.3 <0.2 0.3 0.4  Alkaline Phosphatase 45 63 69 57 57 55 42 R  LDH 116 Low   138 131  122 120  AST 15 19 14 17 14 14 13   ALT 11 7 9 10 8 9 10   GGT 8  8 8  8 9   Iron 62  46 53  44 63  Cholesterol, Total 177  188 189  177 164  Triglycerides 41  44 47  44 40  HDL 79  80 80  73 70  VLDL Cholesterol Cal 9  9 9  9 9   LDL Chol Calc (NIH) 89  99 100 High   95 85  Chol/HDL Ratio 2.2  2.4 CM 2.4 CM  2.4 CM 2.3 CM  Comment:                                   T. Chol/HDL Ratio                                             Men  Women                               1/2 Avg.Risk  3.4    3.3                                   Avg.Risk  5.0    4.4                                2X Avg.Risk  9.6    7.1                                3X Avg.Risk 23.4   11.0  Estimated CHD Risk  < 0.5   < 0.5 CM  < 0.5 CM   < 0.5 CM  <  0.5 CM  Comment: The CHD Risk is based on the T. Chol/HDL ratio. Other factors affect CHD Risk such as hypertension, smoking, diabetes, severe obesity, and family history of premature CHD.  TSH 1.900  2.330 1.580  1.730 1.890  T4, Total 6.7  7.2 6.6  7.1 7.2  T3 Uptake Ratio 27  28 25  29 27   Free Thyroxine Index 1.8  2.0 1.7  2.1 1.9  WBC 4.0  3.0 Low  3.3 Low   4.0 3.1 Low   RBC 4.48  4.32 4.70  4.94 4.49  Hemoglobin 12.9  12.1 13.2  13.4 12.9  Hematocrit 40.0  37.0 39.4  42.1 39.2  MCV 89  86 84  85 87  MCH 28.8  28.0 28.1  27.1 28.7  MCHC 32.3  32.7 33.5  31.8 32.9  RDW 13.0  13.6 13.0  13.7 12.8  Platelets 234  263 207  204 246  Neutrophils 40  41 46  42 46  Lymphs 43  44 40  42 41  Monocytes 8  8 7  8 7   Eos 8  6 5  6 4   Basos 1  1 2  2 2   Neutrophils Absolute 1.6  1.2 Low  1.5  1.7 1.4  Lymphocytes Absolute 1.7  1.3 1.3  1.7 1.2  Monocytes Absolute 0.3  0.2 0.2  0.3 0.2  EOS (ABSOLUTE) 0.3  0.2 0.2  0.2 0.1  Basophils  Absolute 0.1  0.0 0.1  0.1 0.1  Immature Granulocytes 0  0 0  0 0  Immature Grans (Abs) 0.0  0.0 0.0  0.0 0.0  [facility]ing Agency [facility]          View All Conversations on this Encounter         Component Ref Range & Units (hover) 6 d ago 8 mo ago  Estradiol 71.8 83.1 CM  Comment:                      Adult Female             Range                       Follicular phase     12.5 - 166.0                       Ovulation phase      85.8 - 498.0                       Luteal phase         43.8 - 211.0                       Postmenopausal       <6.0 -  54.7                      Pregnancy                       1st trimester     215.0 - >4300.0 Roche ECLIA methodology  Resulting Agency [facility]          View All Conversations on this Encounter          Component Ref Range & Units (hover) 6 d ago 8 mo ago 12 mo ago  Progesterone  1.3 1.9 CM 0.2 CM  Comment:                      Follicular phase       0.1 -   0.9                      Luteal phase           1.8 -  23.9                      Ovulation phase        0.1 -  12.0                      Pregnant                         First trimester    11.0 -  44.3                         Second trimester   25.4 -  83.3                         Third trimester    58.7 - 214.0                      Postmenopausal         0.0 -   0.1  Resulting Agency [facility]          View All Conversations on this Encounter          Component Ref Range & Units (hover) 6 d ago 8 mo ago  DHEA-SO4 83.6 97.9  Resulting Agency [facility]          View All Conversations on this Encounter               Component Ref Range & Units (hover) 6 d ago (07/13/23) 12 mo ago (07/21/22) 1 yr ago (06/01/22) 1 yr ago (01/23/22) 1 yr ago (01/03/22) 1 yr ago (12/14/21) 1 yr ago (08/04/21)  Color, UA Pale Yellow yellow yellow yello0 pale yellow pale yellow Bright Yellow  Clarity, UA  Clear clear clear clear cloudy clear Clear  Glucose, UA Negative Negative Negative Negative Negative Negative Negative  Bilirubin, UA Negative neg neg neg negative negative Negative  Ketones, UA Negative neg neg neg negative negative Negative  Spec Grav, UA <=1.005 Abnormal  1.010 <=1.005 Abnormal  1.010 1.010 1.010 1.010  Blood, UA Negative neg 2+ neg positive CM 3+ CM Negative  pH, UA 7.5 7.5 7.0 7.0 6.5 7.5 7.5  Protein, UA Negative Negative Negative Negative Negative Negative Negative  Urobilinogen, UA 0.2 0.2 0.2 0.2 0.2 0.2 0.2  Nitrite, UA Negative neg neg neg negative negative Negative  Leukocytes, UA Negative Negative Large (3+) Abnormal  Negative Large (3+) Abnormal  Moderate (2+) Abnormal  CM Negative  Appearance    dark  yellow clear   Odor    none  no           _________________________________________  EKG   ____________________________________________    ____________________________________________   INITIAL IMPRESSION / ASSESSMENT AND PLAN  As part of my medical decision making, I reviewed the following data within the electronic MEDICAL RECORD NUMBER       No acute findings on physical exam, labs,  EKG.      ____________________________________________   FINAL CLINICAL IMPRESSION  Well exam ED Discharge Orders     None        Note:  This document was prepared using Dragon voice recognition software and may include unintentional dictation errors.

## 2023-07-24 NOTE — Addendum Note (Signed)
 Addended by: Gardner Candle on: 07/24/2023 04:26 PM   Modules accepted: Orders

## 2023-08-28 ENCOUNTER — Other Ambulatory Visit: Payer: Self-pay | Admitting: Obstetrics and Gynecology

## 2023-08-28 DIAGNOSIS — Z1231 Encounter for screening mammogram for malignant neoplasm of breast: Secondary | ICD-10-CM | POA: Diagnosis not present

## 2023-08-28 DIAGNOSIS — N951 Menopausal and female climacteric states: Secondary | ICD-10-CM | POA: Diagnosis not present

## 2023-08-28 DIAGNOSIS — R87610 Atypical squamous cells of undetermined significance on cytologic smear of cervix (ASC-US): Secondary | ICD-10-CM | POA: Diagnosis not present

## 2023-08-28 DIAGNOSIS — Z01419 Encounter for gynecological examination (general) (routine) without abnormal findings: Secondary | ICD-10-CM | POA: Diagnosis not present

## 2023-08-30 ENCOUNTER — Ambulatory Visit
Admission: RE | Admit: 2023-08-30 | Discharge: 2023-08-30 | Disposition: A | Discharge status: Home / Self Care | Payer: 59 | Source: Ambulatory Visit | Attending: Physician Assistant | Admitting: Physician Assistant

## 2023-08-30 DIAGNOSIS — Z79890 Hormone replacement therapy (postmenopausal): Secondary | ICD-10-CM | POA: Diagnosis not present

## 2023-08-30 DIAGNOSIS — Z780 Asymptomatic menopausal state: Secondary | ICD-10-CM | POA: Insufficient documentation

## 2023-08-30 DIAGNOSIS — M85851 Other specified disorders of bone density and structure, right thigh: Secondary | ICD-10-CM | POA: Insufficient documentation

## 2023-08-30 DIAGNOSIS — Z13820 Encounter for screening for osteoporosis: Secondary | ICD-10-CM | POA: Insufficient documentation

## 2023-10-12 ENCOUNTER — Encounter: Payer: Self-pay | Admitting: Physician Assistant

## 2023-11-21 ENCOUNTER — Other Ambulatory Visit: Payer: Self-pay

## 2023-11-21 DIAGNOSIS — Z79890 Hormone replacement therapy (postmenopausal): Secondary | ICD-10-CM

## 2023-11-21 NOTE — Progress Notes (Signed)
 Pt present today for outside labs. Sonya Burke

## 2023-11-23 LAB — TESTOSTERONE,FREE AND TOTAL
Testosterone, Free: 0.9 pg/mL (ref 0.0–4.2)
Testosterone: 34 ng/dL (ref 4–50)

## 2023-11-23 LAB — DHEA-SULFATE: DHEA-SO4: 70.7 ug/dL (ref 29.4–220.5)

## 2023-11-23 LAB — PROGESTERONE: Progesterone: 0.2 ng/mL

## 2023-11-23 LAB — ESTRADIOL: Estradiol: 85 pg/mL

## 2023-11-26 ENCOUNTER — Encounter

## 2023-11-26 ENCOUNTER — Ambulatory Visit
Admission: RE | Admit: 2023-11-26 | Discharge: 2023-11-26 | Disposition: A | Discharge status: Home / Self Care | Source: Ambulatory Visit | Attending: Obstetrics and Gynecology | Admitting: Obstetrics and Gynecology

## 2023-11-26 DIAGNOSIS — Z1231 Encounter for screening mammogram for malignant neoplasm of breast: Secondary | ICD-10-CM | POA: Diagnosis not present

## 2023-11-27 DIAGNOSIS — N951 Menopausal and female climacteric states: Secondary | ICD-10-CM | POA: Diagnosis not present

## 2023-11-29 DIAGNOSIS — L570 Actinic keratosis: Secondary | ICD-10-CM | POA: Diagnosis not present

## 2023-11-29 DIAGNOSIS — L720 Epidermal cyst: Secondary | ICD-10-CM | POA: Diagnosis not present

## 2023-11-29 DIAGNOSIS — L821 Other seborrheic keratosis: Secondary | ICD-10-CM | POA: Diagnosis not present

## 2024-02-07 ENCOUNTER — Other Ambulatory Visit: Payer: Self-pay

## 2024-02-07 DIAGNOSIS — Z780 Asymptomatic menopausal state: Secondary | ICD-10-CM

## 2024-02-07 DIAGNOSIS — Z79890 Hormone replacement therapy (postmenopausal): Secondary | ICD-10-CM

## 2024-02-07 NOTE — Progress Notes (Signed)
 Labs for Dr. Verdon (OB/GYN).  Reminded Sari to let her physican know that she had the labs done through our office & they are not under her name so no sure if she receives notification the results are available.  States she has an appt with Dr. Nan in October 2025.

## 2024-02-13 LAB — TESTOSTERONE,FREE AND TOTAL
Testosterone, Free: 1 pg/mL (ref 0.0–4.2)
Testosterone: 50 ng/dL (ref 4–50)

## 2024-02-13 LAB — DHEA-SULFATE: DHEA-SO4: 90.2 ug/dL (ref 29.4–220.5)

## 2024-02-13 LAB — ESTRADIOL: Estradiol: 164 pg/mL

## 2024-02-13 LAB — PROGESTERONE: Progesterone: 4.7 ng/mL

## 2024-02-18 ENCOUNTER — Other Ambulatory Visit: Payer: Self-pay

## 2024-02-18 DIAGNOSIS — R300 Dysuria: Secondary | ICD-10-CM

## 2024-02-18 LAB — POCT URINALYSIS DIPSTICK
Bilirubin, UA: NEGATIVE
Glucose, UA: NEGATIVE
Ketones, UA: NEGATIVE
Nitrite, UA: NEGATIVE
Protein, UA: NEGATIVE
Spec Grav, UA: <=1.005 — AB (ref 1.010–1.025)
Urobilinogen, UA: 0.2 U/dL
pH, UA: 6 (ref 5.0–8.0)

## 2024-02-18 NOTE — Progress Notes (Signed)
 UA given for c/o dysuria.  Abnormal UA and culture ready to be sent to [facility] and pt notified.

## 2024-02-19 ENCOUNTER — Other Ambulatory Visit: Payer: Self-pay | Admitting: Physician Assistant

## 2024-02-19 ENCOUNTER — Telehealth: Payer: Self-pay

## 2024-02-19 MED ORDER — PHENAZOPYRIDINE HCL 95 MG PO TABS: 95.0000 mg | ORAL_TABLET | Freq: Three times a day (TID) | ORAL | 0 refills | Status: AC | PRN

## 2024-02-19 NOTE — Telephone Encounter (Signed)
 C/O dysuria while urine culture pending and provider notified.

## 2024-02-20 ENCOUNTER — Other Ambulatory Visit: Payer: Self-pay | Admitting: Physician Assistant

## 2024-02-20 MED ORDER — SULFAMETHOXAZOLE-TRIMETHOPRIM 800-160 MG PO TABS: 1.0000 | ORAL_TABLET | Freq: Two times a day (BID) | ORAL | 0 refills | Status: AC

## 2024-02-21 LAB — URINE CULTURE

## 2024-02-28 DIAGNOSIS — N939 Abnormal uterine and vaginal bleeding, unspecified: Secondary | ICD-10-CM | POA: Diagnosis not present

## 2024-02-28 DIAGNOSIS — N951 Menopausal and female climacteric states: Secondary | ICD-10-CM | POA: Diagnosis not present

## 2024-04-21 DIAGNOSIS — D2271 Melanocytic nevi of right lower limb, including hip: Secondary | ICD-10-CM | POA: Diagnosis not present

## 2024-04-21 DIAGNOSIS — R238 Other skin changes: Secondary | ICD-10-CM | POA: Diagnosis not present

## 2024-04-21 DIAGNOSIS — B078 Other viral warts: Secondary | ICD-10-CM | POA: Diagnosis not present

## 2024-04-21 DIAGNOSIS — D2272 Melanocytic nevi of left lower limb, including hip: Secondary | ICD-10-CM | POA: Diagnosis not present

## 2024-04-21 DIAGNOSIS — D2262 Melanocytic nevi of left upper limb, including shoulder: Secondary | ICD-10-CM | POA: Diagnosis not present

## 2024-04-21 DIAGNOSIS — L821 Other seborrheic keratosis: Secondary | ICD-10-CM | POA: Diagnosis not present

## 2024-04-21 DIAGNOSIS — D2261 Melanocytic nevi of right upper limb, including shoulder: Secondary | ICD-10-CM | POA: Diagnosis not present

## 2024-04-21 DIAGNOSIS — D225 Melanocytic nevi of trunk: Secondary | ICD-10-CM | POA: Diagnosis not present

## 2024-08-13 ENCOUNTER — Encounter: Admitting: Physician Assistant
